# Patient Record
Sex: Female | Born: 1940 | Race: Black or African American | Hispanic: No | Marital: Married | Smoking: Never smoker
Health system: Southern US, Community
[De-identification: ages and names within clinical notes are randomized; demographics above are authoritative.]

## PROBLEM LIST (undated history)

## (undated) DIAGNOSIS — E119 Type 2 diabetes mellitus without complications: Secondary | ICD-10-CM

## (undated) DIAGNOSIS — M109 Gout, unspecified: Secondary | ICD-10-CM

## (undated) DIAGNOSIS — E079 Disorder of thyroid, unspecified: Secondary | ICD-10-CM

## (undated) DIAGNOSIS — I10 Essential (primary) hypertension: Secondary | ICD-10-CM

## (undated) HISTORY — PX: CHOLECYSTECTOMY: SHX55

## (undated) HISTORY — PX: ABDOMINAL HYSTERECTOMY: SHX81

---

## 2014-09-24 ENCOUNTER — Emergency Department (HOSPITAL_COMMUNITY): Payer: Medicare HMO

## 2014-09-24 ENCOUNTER — Encounter (HOSPITAL_COMMUNITY): Payer: Self-pay | Admitting: Emergency Medicine

## 2014-09-24 ENCOUNTER — Inpatient Hospital Stay (HOSPITAL_COMMUNITY)
Admission: EM | Admit: 2014-09-24 | Discharge: 2014-09-26 | DRG: 392 | Disposition: A | Discharge status: Home / Self Care | Payer: Medicare HMO | Attending: Internal Medicine | Admitting: Internal Medicine

## 2014-09-24 DIAGNOSIS — Z823 Family history of stroke: Secondary | ICD-10-CM

## 2014-09-24 DIAGNOSIS — R1084 Generalized abdominal pain: Secondary | ICD-10-CM | POA: Diagnosis not present

## 2014-09-24 DIAGNOSIS — Z79899 Other long term (current) drug therapy: Secondary | ICD-10-CM

## 2014-09-24 DIAGNOSIS — N179 Acute kidney failure, unspecified: Secondary | ICD-10-CM | POA: Diagnosis present

## 2014-09-24 DIAGNOSIS — Z833 Family history of diabetes mellitus: Secondary | ICD-10-CM

## 2014-09-24 DIAGNOSIS — E039 Hypothyroidism, unspecified: Secondary | ICD-10-CM | POA: Diagnosis present

## 2014-09-24 DIAGNOSIS — Z9049 Acquired absence of other specified parts of digestive tract: Secondary | ICD-10-CM | POA: Diagnosis present

## 2014-09-24 DIAGNOSIS — E119 Type 2 diabetes mellitus without complications: Secondary | ICD-10-CM | POA: Diagnosis present

## 2014-09-24 DIAGNOSIS — K529 Noninfective gastroenteritis and colitis, unspecified: Principal | ICD-10-CM | POA: Diagnosis present

## 2014-09-24 DIAGNOSIS — E860 Dehydration: Secondary | ICD-10-CM | POA: Diagnosis present

## 2014-09-24 DIAGNOSIS — R109 Unspecified abdominal pain: Secondary | ICD-10-CM | POA: Diagnosis not present

## 2014-09-24 DIAGNOSIS — R1013 Epigastric pain: Secondary | ICD-10-CM | POA: Diagnosis not present

## 2014-09-24 DIAGNOSIS — E876 Hypokalemia: Secondary | ICD-10-CM | POA: Diagnosis present

## 2014-09-24 DIAGNOSIS — Z8249 Family history of ischemic heart disease and other diseases of the circulatory system: Secondary | ICD-10-CM

## 2014-09-24 DIAGNOSIS — A419 Sepsis, unspecified organism: Secondary | ICD-10-CM | POA: Diagnosis present

## 2014-09-24 DIAGNOSIS — K219 Gastro-esophageal reflux disease without esophagitis: Secondary | ICD-10-CM | POA: Diagnosis present

## 2014-09-24 DIAGNOSIS — R6510 Systemic inflammatory response syndrome (SIRS) of non-infectious origin without acute organ dysfunction: Secondary | ICD-10-CM | POA: Diagnosis present

## 2014-09-24 DIAGNOSIS — R112 Nausea with vomiting, unspecified: Secondary | ICD-10-CM | POA: Diagnosis not present

## 2014-09-24 DIAGNOSIS — I10 Essential (primary) hypertension: Secondary | ICD-10-CM | POA: Diagnosis present

## 2014-09-24 DIAGNOSIS — E872 Acidosis: Secondary | ICD-10-CM | POA: Diagnosis present

## 2014-09-24 DIAGNOSIS — Z794 Long term (current) use of insulin: Secondary | ICD-10-CM | POA: Diagnosis not present

## 2014-09-24 DIAGNOSIS — M109 Gout, unspecified: Secondary | ICD-10-CM | POA: Diagnosis present

## 2014-09-24 DIAGNOSIS — R7989 Other specified abnormal findings of blood chemistry: Secondary | ICD-10-CM | POA: Insufficient documentation

## 2014-09-24 HISTORY — DX: Essential (primary) hypertension: I10

## 2014-09-24 HISTORY — DX: Disorder of thyroid, unspecified: E07.9

## 2014-09-24 HISTORY — DX: Gout, unspecified: M10.9

## 2014-09-24 HISTORY — DX: Type 2 diabetes mellitus without complications: E11.9

## 2014-09-24 LAB — CBC WITH DIFFERENTIAL/PLATELET
BASOS ABS: 0 10*3/uL (ref 0.0–0.1)
Basophils Relative: 0 % (ref 0–1)
EOS ABS: 0.1 10*3/uL (ref 0.0–0.7)
Eosinophils Relative: 0 % (ref 0–5)
HCT: 42.3 % (ref 36.0–46.0)
Hemoglobin: 14.3 g/dL (ref 12.0–15.0)
Lymphocytes Relative: 6 % — ABNORMAL LOW (ref 12–46)
Lymphs Abs: 0.7 10*3/uL (ref 0.7–4.0)
MCH: 32 pg (ref 26.0–34.0)
MCHC: 33.8 g/dL (ref 30.0–36.0)
MCV: 94.6 fL (ref 78.0–100.0)
Monocytes Absolute: 0.6 10*3/uL (ref 0.1–1.0)
Monocytes Relative: 5 % (ref 3–12)
NEUTROS PCT: 89 % — AB (ref 43–77)
Neutro Abs: 11.2 10*3/uL — ABNORMAL HIGH (ref 1.7–7.7)
PLATELETS: 312 10*3/uL (ref 150–400)
RBC: 4.47 MIL/uL (ref 3.87–5.11)
RDW: 13.6 % (ref 11.5–15.5)
WBC: 12.6 10*3/uL — ABNORMAL HIGH (ref 4.0–10.5)

## 2014-09-24 LAB — COMPREHENSIVE METABOLIC PANEL
ALK PHOS: 113 U/L (ref 38–126)
ALT: 62 U/L — AB (ref 14–54)
AST: 76 U/L — AB (ref 15–41)
Albumin: 4.8 g/dL (ref 3.5–5.0)
Anion gap: 16 — ABNORMAL HIGH (ref 5–15)
BILIRUBIN TOTAL: 0.5 mg/dL (ref 0.3–1.2)
BUN: 22 mg/dL — ABNORMAL HIGH (ref 6–20)
CALCIUM: 9.6 mg/dL (ref 8.9–10.3)
CO2: 24 mmol/L (ref 22–32)
Chloride: 100 mmol/L — ABNORMAL LOW (ref 101–111)
Creatinine, Ser: 1.02 mg/dL — ABNORMAL HIGH (ref 0.44–1.00)
GFR calc non Af Amer: 53 mL/min — ABNORMAL LOW (ref >60)
GLUCOSE: 176 mg/dL — AB (ref 70–99)
POTASSIUM: 3.1 mmol/L — AB (ref 3.5–5.1)
Sodium: 140 mmol/L (ref 135–145)
Total Protein: 9 g/dL — ABNORMAL HIGH (ref 6.5–8.1)

## 2014-09-24 LAB — PROTIME-INR
INR: 1.02 (ref 0.00–1.49)
Prothrombin Time: 13.6 seconds (ref 11.6–15.2)

## 2014-09-24 LAB — PROCALCITONIN: Procalcitonin: 2.8 ng/mL

## 2014-09-24 LAB — URINALYSIS, ROUTINE W REFLEX MICROSCOPIC
BILIRUBIN URINE: NEGATIVE
Glucose, UA: NEGATIVE mg/dL
Hgb urine dipstick: NEGATIVE
Ketones, ur: NEGATIVE mg/dL
Leukocytes, UA: NEGATIVE
NITRITE: NEGATIVE
PROTEIN: NEGATIVE mg/dL
Specific Gravity, Urine: 1.029 (ref 1.005–1.030)
UROBILINOGEN UA: 0.2 mg/dL (ref 0.0–1.0)
pH: 5 (ref 5.0–8.0)

## 2014-09-24 LAB — I-STAT CG4 LACTIC ACID, ED
LACTIC ACID, VENOUS: 3.36 mmol/L — AB (ref 0.5–2.0)
Lactic Acid, Venous: 2.98 mmol/L (ref 0.5–2.0)

## 2014-09-24 LAB — GLUCOSE, CAPILLARY: Glucose-Capillary: 148 mg/dL — ABNORMAL HIGH (ref 70–99)

## 2014-09-24 LAB — LIPASE, BLOOD: Lipase: 45 U/L (ref 22–51)

## 2014-09-24 LAB — APTT: aPTT: 28 seconds (ref 24–37)

## 2014-09-24 LAB — LACTIC ACID, PLASMA: Lactic Acid, Venous: 2.7 mmol/L (ref 0.5–2.0)

## 2014-09-24 MED ORDER — PIPERACILLIN-TAZOBACTAM 3.375 G IVPB
3.3750 g | Freq: Three times a day (TID) | INTRAVENOUS | Status: DC
  Filled 2014-09-24 (×2): qty 50

## 2014-09-24 MED ORDER — METRONIDAZOLE IN NACL 5-0.79 MG/ML-% IV SOLN
500.0000 mg | Freq: Three times a day (TID) | INTRAVENOUS | Status: DC
  Administered 2014-09-24 – 2014-09-26 (×5): 500 mg via INTRAVENOUS
  Filled 2014-09-24 (×6): qty 100

## 2014-09-24 MED ORDER — ONDANSETRON HCL 4 MG/2ML IJ SOLN: 4.0000 mg | Freq: Four times a day (QID) | INTRAMUSCULAR | Status: DC | PRN

## 2014-09-24 MED ORDER — ONDANSETRON HCL 4 MG PO TABS: 4.0000 mg | ORAL_TABLET | Freq: Four times a day (QID) | ORAL | Status: DC | PRN

## 2014-09-24 MED ORDER — ONDANSETRON HCL 4 MG/2ML IJ SOLN: 4.0000 mg | Freq: Three times a day (TID) | INTRAMUSCULAR | Status: DC | PRN

## 2014-09-24 MED ORDER — VANCOMYCIN HCL IN DEXTROSE 1-5 GM/200ML-% IV SOLN
1000.0000 mg | Freq: Once | INTRAVENOUS | Status: AC
  Administered 2014-09-24: 1000 mg via INTRAVENOUS
  Filled 2014-09-24: qty 200

## 2014-09-24 MED ORDER — IOHEXOL 300 MG/ML  SOLN
100.0000 mL | Freq: Once | INTRAMUSCULAR | Status: AC | PRN
  Administered 2014-09-24: 100 mL via INTRAVENOUS

## 2014-09-24 MED ORDER — ACETAMINOPHEN 650 MG RE SUPP: 650.0000 mg | Freq: Four times a day (QID) | RECTAL | Status: DC | PRN

## 2014-09-24 MED ORDER — HYDRALAZINE HCL 50 MG PO TABS
100.0000 mg | ORAL_TABLET | Freq: Three times a day (TID) | ORAL | Status: DC
  Administered 2014-09-24 – 2014-09-25 (×4): 100 mg via ORAL
  Filled 2014-09-24 (×7): qty 2

## 2014-09-24 MED ORDER — GI COCKTAIL ~~LOC~~
30.0000 mL | Freq: Once | ORAL | Status: AC
  Administered 2014-09-24: 30 mL via ORAL
  Filled 2014-09-24: qty 30

## 2014-09-24 MED ORDER — MORPHINE SULFATE 2 MG/ML IJ SOLN: 1.0000 mg | INTRAMUSCULAR | Status: DC | PRN

## 2014-09-24 MED ORDER — INSULIN ASPART 100 UNIT/ML ~~LOC~~ SOLN
0.0000 [IU] | Freq: Three times a day (TID) | SUBCUTANEOUS | Status: DC
  Administered 2014-09-25 (×3): 2 [IU] via SUBCUTANEOUS
  Administered 2014-09-26: 1 [IU] via SUBCUTANEOUS

## 2014-09-24 MED ORDER — MORPHINE SULFATE 4 MG/ML IJ SOLN
4.0000 mg | Freq: Once | INTRAMUSCULAR | Status: AC
  Administered 2014-09-24: 4 mg via INTRAVENOUS
  Filled 2014-09-24: qty 1

## 2014-09-24 MED ORDER — SODIUM CHLORIDE 0.9 % IV BOLUS (SEPSIS)
1000.0000 mL | INTRAVENOUS | Status: AC
  Administered 2014-09-24 (×3): 1000 mL via INTRAVENOUS

## 2014-09-24 MED ORDER — POTASSIUM CHLORIDE 10 MEQ/100ML IV SOLN
10.0000 meq | INTRAVENOUS | Status: AC
  Administered 2014-09-24 (×2): 10 meq via INTRAVENOUS
  Filled 2014-09-24: qty 100

## 2014-09-24 MED ORDER — HYDROMORPHONE HCL 1 MG/ML IJ SOLN: 0.5000 mg | INTRAMUSCULAR | Status: DC | PRN

## 2014-09-24 MED ORDER — LEVOTHYROXINE SODIUM 137 MCG PO TABS
137.0000 ug | ORAL_TABLET | Freq: Every day | ORAL | Status: DC
  Administered 2014-09-25 – 2014-09-26 (×2): 137 ug via ORAL
  Filled 2014-09-24 (×3): qty 1

## 2014-09-24 MED ORDER — SODIUM CHLORIDE 0.9 % IV SOLN
Freq: Once | INTRAVENOUS | Status: AC
  Administered 2014-09-24: 1000 mL/h via INTRAVENOUS

## 2014-09-24 MED ORDER — SODIUM CHLORIDE 0.9 % IV BOLUS (SEPSIS)
1000.0000 mL | Freq: Once | INTRAVENOUS | Status: AC
  Administered 2014-09-24: 1000 mL via INTRAVENOUS

## 2014-09-24 MED ORDER — PIPERACILLIN-TAZOBACTAM 3.375 G IVPB 30 MIN
3.3750 g | Freq: Once | INTRAVENOUS | Status: AC
  Administered 2014-09-24: 3.375 g via INTRAVENOUS
  Filled 2014-09-24: qty 50

## 2014-09-24 MED ORDER — CIPROFLOXACIN IN D5W 400 MG/200ML IV SOLN
400.0000 mg | Freq: Two times a day (BID) | INTRAVENOUS | Status: DC
  Administered 2014-09-24 – 2014-09-25 (×3): 400 mg via INTRAVENOUS
  Filled 2014-09-24 (×4): qty 200

## 2014-09-24 MED ORDER — ACETAMINOPHEN 325 MG PO TABS: 650.0000 mg | ORAL_TABLET | Freq: Four times a day (QID) | ORAL | Status: DC | PRN

## 2014-09-24 MED ORDER — OMEGA-3-ACID ETHYL ESTERS 1 G PO CAPS
1.0000 g | ORAL_CAPSULE | Freq: Every day | ORAL | Status: DC
  Administered 2014-09-25: 1 g via ORAL
  Filled 2014-09-24 (×2): qty 1

## 2014-09-24 MED ORDER — IOHEXOL 350 MG/ML SOLN: 100.0000 mL | Freq: Once | INTRAVENOUS | Status: DC | PRN

## 2014-09-24 MED ORDER — ALLOPURINOL 150 MG HALF TABLET
150.0000 mg | ORAL_TABLET | Freq: Every day | ORAL | Status: DC
  Administered 2014-09-24 – 2014-09-25 (×2): 150 mg via ORAL
  Filled 2014-09-24 (×3): qty 1

## 2014-09-24 MED ORDER — AMLODIPINE BESYLATE 10 MG PO TABS
10.0000 mg | ORAL_TABLET | Freq: Every day | ORAL | Status: DC
Start: 2014-09-24 — End: 2014-09-26
  Administered 2014-09-24 – 2014-09-25 (×2): 10 mg via ORAL
  Filled 2014-09-24 (×3): qty 1

## 2014-09-24 MED ORDER — VANCOMYCIN HCL IN DEXTROSE 750-5 MG/150ML-% IV SOLN
750.0000 mg | Freq: Two times a day (BID) | INTRAVENOUS | Status: DC
Start: 2014-09-24 — End: 2014-09-24
  Filled 2014-09-24: qty 150

## 2014-09-24 MED ORDER — LORATADINE 10 MG PO TABS
10.0000 mg | ORAL_TABLET | Freq: Every day | ORAL | Status: DC
  Administered 2014-09-25: 10 mg via ORAL
  Filled 2014-09-24 (×2): qty 1

## 2014-09-24 MED ORDER — SODIUM CHLORIDE 0.9 % IV SOLN
INTRAVENOUS | Status: DC
  Administered 2014-09-24 – 2014-09-25 (×2): 125 mL/h via INTRAVENOUS

## 2014-09-24 MED ORDER — DOXAZOSIN MESYLATE 2 MG PO TABS
2.0000 mg | ORAL_TABLET | Freq: Every day | ORAL | Status: DC
  Administered 2014-09-24 – 2014-09-25 (×2): 2 mg via ORAL
  Filled 2014-09-24 (×3): qty 1

## 2014-09-24 NOTE — ED Notes (Signed)
lactic Acid of 2.98 reported to PA Sherlynn CarbonNicolle and [employer]

## 2014-09-24 NOTE — ED Notes (Signed)
Per EMS pt acute onset of abdominal cramping and emesis approximately an hour ago. Pt given zofran en route; no emesis at present time.

## 2014-09-24 NOTE — ED Provider Notes (Signed)
Medical screening examination/treatment/procedure(s) were conducted as a shared visit with non-physician practitioner(s) and myself.  I personally evaluated the patient during the encounter.   EKG Interpretation None     Patient here with abdominal pain and emesis which began today. Abdominal exam without signs of peritonitis. Patient's lactic acid has been trending numbers. Sepsis order set used an antibiotic will be empirically started. Will be admitted to the hospital  Lorre NickAnthony Sumit Branham, MD 09/24/14 1721

## 2014-09-24 NOTE — H&P (Signed)
[facility] History and Physical  Clinton GallantCornelia Shillingford VHQ:[id number]:[id number] DOB: Mar 10, 1941 DOA: 09/24/2014  Referring physician: Ms.Hannah. PCP: No primary care provider on file. Dr.Velazquez. Specialists: None.  Chief Complaint: Nausea vomiting and abdominal pain.  HPI: Anna Byrd is a 74 y.o. female with history of diabetes mellitus type 2, hypothyroidism, gout, hypertension presents to the ER because of persistent nausea and vomiting with abdominal pain. Patient states her symptoms are at 9:30 AM and 1 hour prior to start of symptoms patient had crab meat. Patient's abdominal pain is mostly in the upper quadrants cramping in nature. Denies any diarrhea. Patient had fever and chills. In the ER patient was found to be febrile tachycardic with lactic acid level elevated. CT abdomen and pelvis was unremarkable. Patient has been admitted for sepsis secondary to intra-abdominal source. Patient denies any chest pain shortness of breath productive cough. Denies any recent travel or sick contacts. Patient states her husband ate the same crab meat but has not had any symptoms.  Review of Systems: As presented in the history of presenting illness, rest negative.  Past Medical History  Diagnosis Date  . Thyroid disease   . Hypertension   . Diabetes mellitus without complication   . Gout    Past Surgical History  Procedure Laterality Date  . Cholecystectomy    . Abdominal hysterectomy     Social History:  reports that she has never smoked. She does not have any smokeless tobacco history on file. She reports that she does not drink alcohol or use illicit drugs. Where does patient live home. Can patient participate in ADLs? Yes.  Allergies  Allergen Reactions  . Other     Crab Legs- nausea/vommitting    Family History:  Family History  Problem Relation Age of Onset  . Diabetes Mellitus II Mother   . Hypertension Mother   . Stroke Sister       Prior to Admission medications    Medication Sig Start Date End Date Taking? Authorizing Provider  allopurinol (ZYLOPRIM) 300 MG tablet Take 150 mg by mouth daily. 08/09/14  Yes Historical Provider, MD  amLODipine (NORVASC) 10 MG tablet Take 1 tablet by mouth daily. 07/09/14  Yes Historical Provider, MD  CINNAMON PO Take 1 tablet by mouth daily.   Yes Historical Provider, MD  doxazosin (CARDURA) 2 MG tablet Take 2 mg by mouth at bedtime.  08/22/14  Yes Historical Provider, MD  hydrALAZINE (APRESOLINE) 50 MG tablet Take 100 mg by mouth 3 (three) times daily. anxiety 08/07/14  Yes Historical Provider, MD  levothyroxine (SYNTHROID, LEVOTHROID) 137 MCG tablet Take 1 tablet by mouth daily. 07/06/14  Yes Historical Provider, MD  loratadine (CLARITIN) 10 MG tablet Take 10 mg by mouth daily.   Yes Historical Provider, MD  metFORMIN (GLUCOPHAGE) 1000 MG tablet Take 1 tablet by mouth 2 (two) times daily. 07/09/14  Yes Historical Provider, MD  Omega-3 Fatty Acids (FISH OIL PO) Take 1 capsule by mouth 2 (two) times daily.   Yes Historical Provider, MD  triamterene-hydrochlorothiazide (MAXZIDE-25) 37.5-25 MG per tablet Take 1 tablet by mouth daily. 07/09/14  Yes Historical Provider, MD  VITAMIN E PO Take 1 tablet by mouth daily.   Yes Historical Provider, MD    Physical Exam: Filed Vitals:   09/24/14 1900 09/24/14 1930 09/24/14 2030 09/24/14 2032  BP: 149/78 143/74  148/80  Pulse: 106 106  112  Temp:    100.2 F (37.9 C)  TempSrc:    Oral  Resp: 26 22  20  Height:   5.2" (0.132 m)   Weight:   98.3 kg (216 lb 11.4 oz)   SpO2: 94% 93%  96%     General:  Well-developed and nourished.  Eyes: Anicteric no pallor.  ENT: No discharge from the ears eyes nose and mouth.  Neck: No mass felt.  Cardiovascular: S1 and S2 heard.  Respiratory: No rhonchi or crepitations.  Abdomen: Soft nontender bowel sounds present. No guarding or rigidity.  Skin: No rash.  Musculoskeletal: No edema.  Psychiatric: Appears normal.  Neurologic: Alert  awake oriented to time place and person. Moves all extremities.  Labs on Admission:  Basic Metabolic Panel:  Recent Labs Lab 09/24/14 1402  NA 140  K 3.1*  CL 100*  CO2 24  GLUCOSE 176*  BUN 22*  CREATININE 1.02*  CALCIUM 9.6   Liver Function Tests:  Recent Labs Lab 09/24/14 1402  AST 76*  ALT 62*  ALKPHOS 113  BILITOT 0.5  PROT 9.0*  ALBUMIN 4.8    Recent Labs Lab 09/24/14 1402  LIPASE 45   No results for input(s): AMMONIA in the last 168 hours. CBC:  Recent Labs Lab 09/24/14 1402  WBC 12.6*  NEUTROABS 11.2*  HGB 14.3  HCT 42.3  MCV 94.6  PLT 312   Cardiac Enzymes: No results for input(s): CKTOTAL, CKMB, CKMBINDEX, TROPONINI in the last 168 hours.  BNP (last 3 results) No results for input(s): BNP in the last 8760 hours.  ProBNP (last 3 results) No results for input(s): PROBNP in the last 8760 hours.  CBG: No results for input(s): GLUCAP in the last 168 hours.  Radiological Exams on Admission: Ct Abdomen Pelvis W Contrast  09/24/2014   CLINICAL DATA:  Upper abdominal pain, nausea and vomiting  EXAM: CT ABDOMEN AND PELVIS WITH CONTRAST  TECHNIQUE: Multidetector CT imaging of the abdomen and pelvis was performed using the standard protocol following bolus administration of intravenous contrast.  CONTRAST:  100mL OMNIPAQUE IOHEXOL 300 MG/ML  SOLN  COMPARISON:  None.  FINDINGS: Lower chest: Lung bases are clear. Nodularity in the right central breast is partly imaged.  Hepatobiliary: Cholecystectomy clips are noted. Hepatic hypodensity suggests steatosis without focal abnormality or intrahepatic ductal dilatation.  Pancreas: Normal  Spleen: Normal  Adrenals/Urinary Tract: Adrenal glands are normal. 5 mm too small to characterize right mid renal cortical hypodense lesions are identified images 40 and 41, respectively, statistically most likely cysts. No hydroureteronephrosis. No radiopaque renal, ureteral, or bladder calculus. The bladder is decompressed but  unremarkable.  Stomach/Bowel: Appendix not identified but no secondary evidence for acute appendicitis is identified. No bowel wall thickening or focal segmental dilatation is identified.  Vascular/Lymphatic: Mild atheromatous aortic calcification without aneurysm. No lymphadenopathy.  Reproductive: Uterus and ovaries presumed surgically absent, not visualized. No adnexal mass.  Other: No free air or fluid.  Musculoskeletal: Lumbar spine disc degenerative change is identified, most prominent at L4-L5. No acute osseous abnormality.  IMPRESSION: No acute intra-abdominal or pelvic pathology.   Electronically Signed   By: Christiana PellantGretchen  Green M.D.   On: 09/24/2014 16:34   Dg Chest Port 1 View  09/24/2014   CLINICAL DATA:  Acute onset of abdominal cramping and emesis 1 hr ago  EXAM: PORTABLE CHEST - 1 VIEW  COMPARISON:  None.  FINDINGS: A single AP portable view of the chest demonstrates no focal airspace consolidation or alveolar edema. The lungs are grossly clear. There is no large effusion or pneumothorax. Cardiac and mediastinal contours appear unremarkable.  IMPRESSION: No active  disease.   Electronically Signed   By: Ellery Plunk M.D.   On: 09/24/2014 18:17     Assessment/Plan Principal Problem:   Sepsis Active Problems:   Abdominal pain   Diabetes mellitus type 2, controlled   Essential hypertension   1. Sepsis most likely source is intra-abdominal - follow blood cultures. At this time patient has been empirically placed on Cipro and Flagyl. Check pro-calcitonin levels and closely follow lactic acid level as patient gets hydrated. Since patient has had some blood-tinged vomitus follow CBC closely. 2. Elevated LFTs most likely from sepsis - CT abdomen and pelvis was unremarkable. Follow LFTs. 3. Renal failure probably acute - we do not have any baseline creatinine on the patient. Continue hydration and hold diuretics and follow metabolic panel. 4. Diabetes mellitus type 2 - hold metformin while  patient has lactic acidosis patient has been placed on sliding scale coverage. 5. Hypertension - hold diuretics while patient is getting aggressive hydration. 6. Gout on allopurinol.   DVT Prophylaxis since patient complained of some blood-tinged vomitus will keep patient on SCDs for now and oral CBC.  Code Status: Full code.  Family Communication: None.  Disposition Plan: Admit to inpatient. Will likely stay would be 2-3 days.    KAKRAKANDY,ARSHAD N. [facility] Pager [phone].  If 7PM-7AM, please contact night-coverage [email or web address] Password [email or web address] 09/24/2014, 9:07 PM

## 2014-09-24 NOTE — ED Notes (Signed)
Labs drawn by trevor 

## 2014-09-24 NOTE — ED Provider Notes (Signed)
CSN: [id number]     Arrival date & time 09/24/14  1332 History   First MD Initiated Contact with Patient 09/24/14 1504     Chief Complaint  Patient presents with  . Abdominal Pain     (Consider location/radiation/quality/duration/timing/severity/associated sxs/prior Treatment) The history is provided by the patient, the spouse and medical records. No language interpreter was used.     Nancey Montez MoritaCarter is a 74 y.o. female  with a hx of HTN, hypothyroid, NIDDM, gout  presents to the Emergency Department complaining of acute, persistent, generalized abd pain with associated vomiting x10 onset 11:30am. Pt reports 1 episode of blood streaks after intense vomiting.  Pt denies bilious emesis.  Pt reports she ate crab legs around 9:30am this morning.  Pt reports hx of cholecystectomy (1986),  or GI problems.  Pt rates her pain at a 9/10 without radiation and described as aching.  Nothing makes it better or worse.  Pt denies fever, chills, headache, neck pain, chest pain, SOB, diarrhea, weakness, dizziness, syncope.      Past Medical History  Diagnosis Date  . Thyroid disease   . Hypertension   . Diabetes mellitus without complication   . Gout    History reviewed. No pertinent past surgical history. No family history on file. History  Substance Use Topics  . Smoking status: Never Smoker   . Smokeless tobacco: Not on file  . Alcohol Use: No   OB History    No data available     Review of Systems  Constitutional: Negative for fever, diaphoresis, appetite change, fatigue and unexpected weight change.  HENT: Negative for mouth sores and trouble swallowing.   Respiratory: Negative for cough, chest tightness, shortness of breath, wheezing and stridor.   Cardiovascular: Negative for chest pain and palpitations.  Gastrointestinal: Positive for nausea, vomiting and abdominal pain. Negative for diarrhea, constipation, blood in stool, abdominal distention and rectal pain.  Genitourinary: Negative  for dysuria, urgency, frequency, hematuria, flank pain and difficulty urinating.  Musculoskeletal: Negative for back pain, neck pain and neck stiffness.  Skin: Negative for rash.  Neurological: Negative for weakness.  Hematological: Negative for adenopathy.  Psychiatric/Behavioral: Negative for confusion.  All other systems reviewed and are negative.     Allergies  Other  Home Medications   Prior to Admission medications   Medication Sig Start Date End Date Taking? Authorizing Provider  allopurinol (ZYLOPRIM) 300 MG tablet Take 150 mg by mouth daily. 08/09/14  Yes Historical Provider, MD  amLODipine (NORVASC) 10 MG tablet Take 1 tablet by mouth daily. 07/09/14  Yes Historical Provider, MD  CINNAMON PO Take 1 tablet by mouth daily.   Yes Historical Provider, MD  doxazosin (CARDURA) 2 MG tablet Take 2 mg by mouth at bedtime.  08/22/14  Yes Historical Provider, MD  hydrALAZINE (APRESOLINE) 50 MG tablet Take 100 mg by mouth 3 (three) times daily. anxiety 08/07/14  Yes Historical Provider, MD  levothyroxine (SYNTHROID, LEVOTHROID) 137 MCG tablet Take 1 tablet by mouth daily. 07/06/14  Yes Historical Provider, MD  loratadine (CLARITIN) 10 MG tablet Take 10 mg by mouth daily.   Yes Historical Provider, MD  metFORMIN (GLUCOPHAGE) 1000 MG tablet Take 1 tablet by mouth 2 (two) times daily. 07/09/14  Yes Historical Provider, MD  Omega-3 Fatty Acids (FISH OIL PO) Take 1 capsule by mouth 2 (two) times daily.   Yes Historical Provider, MD  triamterene-hydrochlorothiazide (MAXZIDE-25) 37.5-25 MG per tablet Take 1 tablet by mouth daily. 07/09/14  Yes Historical Provider, MD  VITAMIN E PO Take 1 tablet by mouth daily.   Yes Historical Provider, MD   BP 149/78 mmHg  Pulse 106  Temp(Src) 100.3 F (37.9 C) (Rectal)  Resp 26  Wt 212 lb (96.163 kg)  SpO2 94% Physical Exam  Constitutional: She appears well-developed and well-nourished. No distress.  Awake, alert, nontoxic appearance  HENT:  Head:  Normocephalic and atraumatic.  Mouth/Throat: Oropharynx is clear and moist. No oropharyngeal exudate.  Eyes: Conjunctivae are normal. No scleral icterus.  Neck: Normal range of motion. Neck supple.  Cardiovascular: Regular rhythm and intact distal pulses.  Tachycardia present.   Pulses:      Radial pulses are 2+ on the right side, and 2+ on the left side.       Dorsalis pedis pulses are 2+ on the right side, and 2+ on the left side.  Pulmonary/Chest: Effort normal and breath sounds normal. No respiratory distress. She has no wheezes.  Equal chest expansion  Abdominal: Soft. Bowel sounds are normal. She exhibits no distension and no mass. There is no tenderness. There is no rebound, no guarding and no CVA tenderness.  Obese Abd is soft and nontender, no rigidity or rebound Multiple well healed midline surgical incisions  Musculoskeletal: Normal range of motion. She exhibits no edema.  Neurological: She is alert.  Speech is clear and goal oriented Moves extremities without ataxia  Skin: Skin is warm and dry. She is not diaphoretic.  Psychiatric: She has a normal mood and affect.  Nursing note and vitals reviewed.   ED Course  Procedures (including critical care time) Labs Review Labs Reviewed  CBC WITH DIFFERENTIAL/PLATELET - Abnormal; Notable for the following:    WBC 12.6 (*)    Neutrophils Relative % 89 (*)    Neutro Abs 11.2 (*)    Lymphocytes Relative 6 (*)    All other components within normal limits  COMPREHENSIVE METABOLIC PANEL - Abnormal; Notable for the following:    Potassium 3.1 (*)    Chloride 100 (*)    Glucose, Bld 176 (*)    BUN 22 (*)    Creatinine, Ser 1.02 (*)    Total Protein 9.0 (*)    AST 76 (*)    ALT 62 (*)    GFR calc non Af Amer 53 (*)    Anion gap 16 (*)    All other components within normal limits  I-STAT CG4 LACTIC ACID, ED - Abnormal; Notable for the following:    Lactic Acid, Venous 2.98 (*)    All other components within normal limits   I-STAT CG4 LACTIC ACID, ED - Abnormal; Notable for the following:    Lactic Acid, Venous 3.36 (*)    All other components within normal limits  CULTURE, BLOOD (ROUTINE X 2)  CULTURE, BLOOD (ROUTINE X 2)  URINE CULTURE  LIPASE, BLOOD  URINALYSIS, ROUTINE W REFLEX MICROSCOPIC    Imaging Review Ct Abdomen Pelvis W Contrast  09/24/2014   CLINICAL DATA:  Upper abdominal pain, nausea and vomiting  EXAM: CT ABDOMEN AND PELVIS WITH CONTRAST  TECHNIQUE: Multidetector CT imaging of the abdomen and pelvis was performed using the standard protocol following bolus administration of intravenous contrast.  CONTRAST:  OMNIPAQUE IOHEXOL 300 MG/ML  SOLN  COMPARISON:  None.  FINDINGS: Lower chest: Lung bases are clear. Nodularity in the right central breast is partly imaged.  Hepatobiliary: Cholecystectomy clips are noted. Hepatic hypodensity suggests steatosis without focal abnormality or intrahepatic ductal dilatation.  Pancreas: Normal  Spleen: Normal  Adrenals/Urinary Tract: Adrenal glands are normal. 5 mm too small to characterize right mid renal cortical hypodense lesions are identified images 40 and 41, respectively, statistically most likely cysts. No hydroureteronephrosis. No radiopaque renal, ureteral, or bladder calculus. The bladder is decompressed but unremarkable.  Stomach/Bowel: Appendix not identified but no secondary evidence for acute appendicitis is identified. No bowel wall thickening or focal segmental dilatation is identified.  Vascular/Lymphatic: Mild atheromatous aortic calcification without aneurysm. No lymphadenopathy.  Reproductive: Uterus and ovaries presumed surgically absent, not visualized. No adnexal mass.  Other: No free air or fluid.  Musculoskeletal: Lumbar spine disc degenerative change is identified, most prominent at L4-L5. No acute osseous abnormality.  IMPRESSION: No acute intra-abdominal or pelvic pathology.   Electronically Signed   By: Christiana PellantGretchen  Green M.D.   On: 09/24/2014  16:34   Dg Chest Port 1 View  09/24/2014   CLINICAL DATA:  Acute onset of abdominal cramping and emesis 1 hr ago  EXAM: PORTABLE CHEST - 1 VIEW  COMPARISON:  None.  FINDINGS: A single AP portable view of the chest demonstrates no focal airspace consolidation or alveolar edema. The lungs are grossly clear. There is no large effusion or pneumothorax. Cardiac and mediastinal contours appear unremarkable.  IMPRESSION: No active disease.   Electronically Signed   By: Ellery Plunkaniel R Mitchell M.D.   On: 09/24/2014 18:17     EKG Interpretation None      MDM   Final diagnoses:  Abdominal pain  SIRS (systemic inflammatory response syndrome)  Elevated lactic acid level     Anneke Montez MoritaCarter presents with sudden onset abd pain; nausea and vomiting.  Labs with mild hypokalemia, leukocytosis.  Pt appears uncomfortable, but abd is nontender on exam.  Elevated lactic acid.  Pt is being given fluids, pain control. CT pending.    5:02 PM Pt with increasing lactic acid, persistent abd pain.  CT abd without acute abnormality.  Concerned as pt's lactic acid is rising.  Will begin sepsis protocol including fluids and antibiotics, but concern for possible mesenteric ischemia with increasing lactic, pain out of proportion on exam and vomiting.    5:14 PM Discussed with Dr. Chilton SiGreen of radiology who doesn't currently see any blatant signs of mesenteric ischemia.  Pt with fluid is her colon which might indicated a gastroenteritis.  Will continue to look for signs of infection and monitor lactic acid and pain levels.  Sepsis protocol initiated, fluid bolus, blood cultures and antibiotics.    7:23 PM Discussed with Dr. Toniann FailKakrakandy who will admit to med-surg.    The patient was discussed with and seen by Dr. Freida BusmanAllen who agrees with the treatment plan.   Dierdre ForthHannah Taelyn Broecker, PA-C 09/24/14 1923

## 2014-09-24 NOTE — ED Notes (Signed)
Family at bedside. 

## 2014-09-24 NOTE — ED Notes (Signed)
Bed: [id number] Expected date:  Expected time:  Means of arrival:  Comments: EMS- sudden emesis, IV established

## 2014-09-24 NOTE — ED Notes (Addendum)
Notified RN if lactic result 3.36

## 2014-09-24 NOTE — Progress Notes (Addendum)
ANTIBIOTIC CONSULT NOTE - INITIAL  Pharmacy Consult for vancomycin/zosyn --> ciprofloxacin/flagyl Indication: rule out sepsis (intra-abdominal)  Allergies  Allergen Reactions  . Other     Crab Legs- nausea/vommitting    Patient Measurements: Weight: 212 lb (96.163 kg) Adjusted Body Weight:   Vital Signs: Temp: 97.8 F (36.6 C) (05/09 1337) Temp Source: Oral (05/09 1337) BP: 129/66 mmHg (05/09 1544) Pulse Rate: 100 (05/09 1544) Intake/Output from previous day:   Intake/Output from this shift:    Labs:  Recent Labs  09/24/14 1402  WBC 12.6*  HGB 14.3  PLT 312  CREATININE 1.02*   CrCl cannot be calculated (Unknown ideal weight.). No results for input(s): VANCOTROUGH, VANCOPEAK, VANCORANDOM, GENTTROUGH, GENTPEAK, GENTRANDOM, TOBRATROUGH, TOBRAPEAK, TOBRARND, AMIKACINPEAK, AMIKACINTROU, AMIKACIN in the last 72 hours.   Microbiology: No results found for this or any previous visit (from the past 720 hour(s)).  Medical History: Past Medical History  Diagnosis Date  . Thyroid disease   . Hypertension   . Diabetes mellitus without complication   . Gout    Assessment: [age] YOF presents with abdominal pain and vomiting.  CT of abdomen was unrevealing. Lactic acid is trending up and code sepsis in ED called.  Vancomycin and zosyn per pharmacy ordered  5/9 >> vancomycin >> 5/9 5/9 >> zosyn >> 5/9 5/9 >> ciprofloxacin >> 5/9 >> metronidazole >>  5/9 Blood:  5/9 Urine:  WBC mildly elevated Afebrile Normalized CrCl = 3956ml/min  Goal of Therapy:  Vancomycin trough level 15-20 mcg/ml  Plan:   Vancomycin 1000mg  IV x 1 in ED then 750mg  IV q12h starting tonight to complete staggered loading dose  Check vancomycin trough if remains on vancomycin > 3 days  Follow renal function  Zosyn 3..375 gm IV over 30min x 1 then q8h over 4h infusion  Dannielle HuhZeigler, Dustin George 09/24/2014,5:10 PM   Addendum:   Upon admission [facility] has changed vanco/zosyn to ciprofloxacin with  pharmacy to dose (metronidazole per physician)   Ciprofloxacin 400mg  IV q12h  No dose adjustment needed at this time  Juliette Alcideustin Zeigler, PharmD, BCPS.   Pager: [phone] 09/24/2014 9:15 PM

## 2014-09-25 DIAGNOSIS — R1084 Generalized abdominal pain: Secondary | ICD-10-CM

## 2014-09-25 LAB — CBC WITH DIFFERENTIAL/PLATELET
BASOS PCT: 0 % (ref 0–1)
Basophils Absolute: 0 10*3/uL (ref 0.0–0.1)
EOS PCT: 0 % (ref 0–5)
Eosinophils Absolute: 0 10*3/uL (ref 0.0–0.7)
HCT: 31.2 % — ABNORMAL LOW (ref 36.0–46.0)
Hemoglobin: 10.6 g/dL — ABNORMAL LOW (ref 12.0–15.0)
Lymphocytes Relative: 20 % (ref 12–46)
Lymphs Abs: 2.3 10*3/uL (ref 0.7–4.0)
MCH: 31.8 pg (ref 26.0–34.0)
MCHC: 34 g/dL (ref 30.0–36.0)
MCV: 93.7 fL (ref 78.0–100.0)
MONO ABS: 0.9 10*3/uL (ref 0.1–1.0)
MONOS PCT: 8 % (ref 3–12)
NEUTROS PCT: 72 % (ref 43–77)
Neutro Abs: 8.5 10*3/uL — ABNORMAL HIGH (ref 1.7–7.7)
PLATELETS: 241 10*3/uL (ref 150–400)
RBC: 3.33 MIL/uL — ABNORMAL LOW (ref 3.87–5.11)
RDW: 13.6 % (ref 11.5–15.5)
WBC: 11.7 10*3/uL — ABNORMAL HIGH (ref 4.0–10.5)

## 2014-09-25 LAB — URINE CULTURE
Colony Count: NO GROWTH
Culture: NO GROWTH

## 2014-09-25 LAB — COMPREHENSIVE METABOLIC PANEL
ALT: 48 U/L (ref 14–54)
AST: 48 U/L — AB (ref 15–41)
Albumin: 2.9 g/dL — ABNORMAL LOW (ref 3.5–5.0)
Alkaline Phosphatase: 72 U/L (ref 38–126)
Anion gap: 4 — ABNORMAL LOW (ref 5–15)
BUN: 15 mg/dL (ref 6–20)
CALCIUM: 7.1 mg/dL — AB (ref 8.9–10.3)
CO2: 22 mmol/L (ref 22–32)
Chloride: 111 mmol/L (ref 101–111)
Creatinine, Ser: 0.78 mg/dL (ref 0.44–1.00)
GFR calc non Af Amer: >60 mL/min (ref >60)
Glucose, Bld: 159 mg/dL — ABNORMAL HIGH (ref 70–99)
Potassium: 2.9 mmol/L — ABNORMAL LOW (ref 3.5–5.1)
SODIUM: 137 mmol/L (ref 135–145)
Total Bilirubin: 0.6 mg/dL (ref 0.3–1.2)
Total Protein: 6.3 g/dL — ABNORMAL LOW (ref 6.5–8.1)

## 2014-09-25 LAB — ABO/RH: ABO/RH(D): O POS

## 2014-09-25 LAB — CBC
HCT: 32.3 % — ABNORMAL LOW (ref 36.0–46.0)
Hemoglobin: 10.8 g/dL — ABNORMAL LOW (ref 12.0–15.0)
MCH: 31.5 pg (ref 26.0–34.0)
MCHC: 33.4 g/dL (ref 30.0–36.0)
MCV: 94.2 fL (ref 78.0–100.0)
PLATELETS: 251 10*3/uL (ref 150–400)
RBC: 3.43 MIL/uL — AB (ref 3.87–5.11)
RDW: 13.7 % (ref 11.5–15.5)
WBC: 11 10*3/uL — ABNORMAL HIGH (ref 4.0–10.5)

## 2014-09-25 LAB — LACTIC ACID, PLASMA
LACTIC ACID, VENOUS: 2.6 mmol/L — AB (ref 0.5–2.0)
LACTIC ACID, VENOUS: 2.1 mmol/L — AB (ref 0.5–2.0)

## 2014-09-25 LAB — TYPE AND SCREEN
ABO/RH(D): O POS
Antibody Screen: NEGATIVE

## 2014-09-25 LAB — GLUCOSE, CAPILLARY
GLUCOSE-CAPILLARY: 166 mg/dL — AB (ref 70–99)
GLUCOSE-CAPILLARY: 145 mg/dL — AB (ref 70–99)
Glucose-Capillary: 152 mg/dL — ABNORMAL HIGH (ref 70–99)
Glucose-Capillary: 162 mg/dL — ABNORMAL HIGH (ref 70–99)

## 2014-09-25 LAB — MAGNESIUM: Magnesium: 1.4 mg/dL — ABNORMAL LOW (ref 1.7–2.4)

## 2014-09-25 MED ORDER — POTASSIUM CHLORIDE CRYS ER 20 MEQ PO TBCR: 40.0000 meq | EXTENDED_RELEASE_TABLET | Freq: Once | ORAL | Status: DC

## 2014-09-25 MED ORDER — PANTOPRAZOLE SODIUM 40 MG IV SOLR
40.0000 mg | Freq: Two times a day (BID) | INTRAVENOUS | Status: DC
  Administered 2014-09-25 (×2): 40 mg via INTRAVENOUS
  Filled 2014-09-25 (×4): qty 40

## 2014-09-25 MED ORDER — SODIUM CHLORIDE 0.9 % IV BOLUS (SEPSIS)
1000.0000 mL | Freq: Once | INTRAVENOUS | Status: AC
  Administered 2014-09-25: 1000 mL via INTRAVENOUS

## 2014-09-25 MED ORDER — POTASSIUM CHLORIDE 10 MEQ/100ML IV SOLN
10.0000 meq | INTRAVENOUS | Status: AC
  Administered 2014-09-25 (×4): 10 meq via INTRAVENOUS
  Filled 2014-09-25 (×4): qty 100

## 2014-09-25 MED ORDER — POTASSIUM CHLORIDE CRYS ER 20 MEQ PO TBCR
40.0000 meq | EXTENDED_RELEASE_TABLET | ORAL | Status: AC
Start: 2014-09-25 — End: 2014-09-25
  Administered 2014-09-25 (×2): 40 meq via ORAL
  Filled 2014-09-25 (×2): qty 2

## 2014-09-25 MED ORDER — POTASSIUM CHLORIDE IN NACL 20-0.9 MEQ/L-% IV SOLN
INTRAVENOUS | Status: DC
  Filled 2014-09-25 (×2): qty 1000

## 2014-09-25 MED ORDER — MAGNESIUM SULFATE 2 GM/50ML IV SOLN
2.0000 g | Freq: Once | INTRAVENOUS | Status: AC
  Administered 2014-09-25: 2 g via INTRAVENOUS
  Filled 2014-09-25: qty 50

## 2014-09-25 NOTE — Progress Notes (Signed)
Patient Demographics  Anna Byrd, is a 74 y.o. female, DOB - September 08, 1940, ONG:[id number]  Admit date - 09/24/2014   Admitting Physician Eduard Clos, MD  Outpatient Primary MD for the patient is No primary care provider on file.  LOS - 1   Chief Complaint  Patient presents with  . Abdominal Pain        Subjective:   Anna Byrd today has, No headache, No chest pain, No abdominal pain - No Nausea, No new weakness tingling or numbness, No Cough - SOB. In terms free now  Assessment & Plan    1. Gastroenteritis induced dehydration and elevated lactic acid. Do not think she was septic, nontoxic appearing, afebrile, has rapidly improved and now completely symptom free. She did have some seafood after which she got sick. Continue Cipro Flagyl. Monitor cultures for 24 hours, and tinea hydration and advance diet. Monitor with supportive care.   2. DM type II. Hold metformin due to elevated lactic acid. On sliding scale.  CBG (last 3)   Recent Labs  09/24/14 2220 09/25/14 0749  GLUCAP 148* 152*    3. Hypertension. On Norvasc and hydralazine which will be continued.   4. GERD. On PPI.   5. Gout. Continue allopurinol.   6. ARF. From dehydration due to #1 above. Resolved with IV fluids.   7. Hypokalemia and hypomagnesemia. Both replaced will recheck in the morning.   8. Mildly elevated LFTs. Due to #1 above. CT scan abdomen pelvis stable, symptom-free, no right upper quadrant tenderness. Repeat CMP in the morning.    Code Status: Full  Family Communication: None present  Disposition Plan: Home likely 09/26/2014   Procedures CT scan abdomen and pelvis. Unremarkable   Consults      Medications  Scheduled Meds: . allopurinol  150 mg Oral Daily  . amLODipine   10 mg Oral Daily  . ciprofloxacin  400 mg Intravenous BID  . doxazosin  2 mg Oral QHS  . hydrALAZINE  100 mg Oral TID  . insulin aspart  0-9 Units Subcutaneous TID WC  . levothyroxine  137 mcg Oral QAC breakfast  . loratadine  10 mg Oral Daily  . metronidazole  500 mg Intravenous Q8H  . omega-3 acid ethyl esters  1 g Oral Daily  . pantoprazole (PROTONIX) IV  40 mg Intravenous Q12H  . potassium chloride  40 mEq Oral Q4H   Continuous Infusions: . 0.9 % NaCl with KCl 20 mEq / L     PRN Meds:.acetaminophen **OR** acetaminophen, morphine injection, ondansetron **OR** ondansetron (ZOFRAN) IV  DVT Prophylaxis   SCDs    Lab Results  Component Value Date   PLT 251 09/25/2014    Antibiotics     Anti-infectives    Start     Dose/Rate Route Frequency Ordered Stop   09/25/14 0000  piperacillin-tazobactam (ZOSYN) IVPB 3.375 g  Status:  Discontinued     3.375 g 12.5 mL/hr over 240 Minutes Intravenous 3 times per day 09/24/14 1721 09/24/14 2118   09/25/14 0000  ciprofloxacin (CIPRO) IVPB 400 mg     400 mg 200 mL/hr over 60 Minutes Intravenous 2 times daily 09/24/14 2117     09/24/14 2200  vancomycin (VANCOCIN) IVPB 750 mg/150 ml premix  Status:  Discontinued     750 mg 150 mL/hr over 60 Minutes Intravenous Every 12 hours 09/24/14 1721 09/24/14 2118   09/24/14 2200  metroNIDAZOLE (FLAGYL) IVPB 500 mg     500 mg 100 mL/hr over 60 Minutes Intravenous Every 8 hours 09/24/14 2106     09/24/14 1715  piperacillin-tazobactam (ZOSYN) IVPB 3.375 g     3.375 g 100 mL/hr over 30 Minutes Intravenous  Once 09/24/14 1702 09/24/14 1806   09/24/14 1715  vancomycin (VANCOCIN) IVPB 1000 mg/200 mL premix     1,000 mg 200 mL/hr over 60 Minutes Intravenous  Once 09/24/14 1702 09/24/14 1950          Objective:   Filed Vitals:   09/24/14 2157 09/25/14 0134 09/25/14 0529 09/25/14 0951  BP: 138/66 123/59 134/62 131/72  Pulse: 109 99 100 93  Temp: 99.9 F (37.7 C) 100 F (37.8 C) 98.9 F (37.2 C)  98.9 F (37.2 C)  TempSrc: Oral Oral Oral Oral  Resp: 20 18 18 18   Height:      Weight:      SpO2: 95% 96% 94% 96%    Wt Readings from Last 3 Encounters:  09/24/14 98.3 kg (216 lb 11.4 oz)     Intake/Output Summary (Last 24 hours) at 09/25/14 1118 Last data filed at 09/25/14 0951  Gross per 24 hour  Intake   3540 ml  Output   3200 ml  Net    340 ml     Physical Exam  Awake Alert, Oriented X 3, No new F.N deficits, Normal affect Anna Byrd,PERRAL Supple Neck,No JVD, No cervical lymphadenopathy appriciated.  Symmetrical Chest wall movement, Good air movement bilaterally, CTAB RRR,No Gallops,Rubs or new Murmurs, No Parasternal Heave +ve B.Sounds, Abd Soft, No tenderness, No organomegaly appriciated, No rebound - guarding or rigidity. No Cyanosis, Clubbing or edema, No new Rash or bruise      Data Review   Micro Results No results found for this or any previous visit (from the past 240 hour(s)).  Radiology Reports Ct Abdomen Pelvis W Contrast  09/24/2014   CLINICAL DATA:  Upper abdominal pain, nausea and vomiting  EXAM: CT ABDOMEN AND PELVIS WITH CONTRAST  TECHNIQUE: Multidetector CT imaging of the abdomen and pelvis was performed using the standard protocol following bolus administration of intravenous contrast.  CONTRAST:  100mL OMNIPAQUE IOHEXOL 300 MG/ML  SOLN  COMPARISON:  None.  FINDINGS: Lower chest: Lung bases are clear. Nodularity in the right central breast is partly imaged.  Hepatobiliary: Cholecystectomy clips are noted. Hepatic hypodensity suggests steatosis without focal abnormality or intrahepatic ductal dilatation.  Pancreas: Normal  Spleen: Normal  Adrenals/Urinary Tract: Adrenal glands are normal. 5 mm too small to characterize right mid renal cortical hypodense lesions are identified images 40 and 41, respectively, statistically most likely cysts. No hydroureteronephrosis. No radiopaque renal, ureteral, or bladder calculus. The bladder is decompressed but  unremarkable.  Stomach/Bowel: Appendix not identified but no secondary evidence for acute appendicitis is identified. No bowel wall thickening or focal segmental dilatation is identified.  Vascular/Lymphatic: Mild atheromatous aortic calcification without aneurysm. No lymphadenopathy.  Reproductive: Uterus and ovaries presumed surgically absent, not visualized. No adnexal mass.  Other: No free air or fluid.  Musculoskeletal: Lumbar spine disc degenerative change is identified, most prominent at L4-L5. No acute osseous abnormality.  IMPRESSION: No acute intra-abdominal or pelvic pathology.   Electronically Signed   By: Christiana PellantGretchen  Green M.D.   On: 09/24/2014 16:34   [facility]  09[facility]2014   CLINICAL DATA:  Acute onset of abdominal cramping and emesis 1 hr ago  EXAM: PORTABLE CHEST - 1 VIEW  COMPARISON:  None.  FINDINGS: A single AP portable view of the chest demonstrates no focal airspace consolidation or alveolar edema. The lungs are grossly clear. There is no large effusion or pneumothorax. Cardiac and mediastinal contours appear unremarkable.  IMPRESSION: No active disease.   Electronically Signed   By: Ellery Plunkaniel R Mitchell M.D.   On: 09/24/2014 18:17     CBC  Recent Labs Lab 09/24/14 1402 09/25/14 0400 09/25/14 0830  WBC 12.6* 11.7* 11.0*  HGB 14.3 10.6* 10.8*  HCT 42.3 31.2* 32.3*  PLT 312 241 251  MCV 94.6 93.7 94.2  MCH 32.0 31.8 31.5  MCHC 33.8 34.0 33.4  RDW 13.6 13.6 13.7  LYMPHSABS 0.7 2.3  --   MONOABS 0.6 0.9  --   EOSABS 0.1 0.0  --   BASOSABS 0.0 0.0  --     Chemistries   Recent Labs Lab 09/24/14 1402 09/25/14 0400 09/25/14 0428  NA 140 137  --   K 3.1* 2.9*  --   CL 100* 111  --   CO2 24 22  --   GLUCOSE 176* 159*  --   BUN 22* 15  --   CREATININE 1.02* 0.78  --   CALCIUM 9.6 7.1*  --   MG  --   --  1.4*  AST 76* 48*  --   ALT 62* 48  --   ALKPHOS 113 72  --   BILITOT 0.5 0.6  --     ------------------------------------------------------------------------------------------------------------------ CrCl cannot be calculated (Unknown ideal weight.). ------------------------------------------------------------------------------------------------------------------ No results for input(s): HGBA1C in the last 72 hours. ------------------------------------------------------------------------------------------------------------------ No results for input(s): CHOL, HDL, LDLCALC, TRIG, CHOLHDL, LDLDIRECT in the last 72 hours. ------------------------------------------------------------------------------------------------------------------ No results for input(s): TSH, T4TOTAL, T3FREE, THYROIDAB in the last 72 hours.  Invalid input(s): FREET3 ------------------------------------------------------------------------------------------------------------------ No results for input(s): VITAMINB12, FOLATE, FERRITIN, TIBC, IRON, RETICCTPCT in the last 72 hours.  Coagulation profile  Recent Labs Lab 09/24/14 2215  INR 1.02    No results for input(s): DDIMER in the last 72 hours.  Cardiac Enzymes No results for input(s): CKMB, TROPONINI, MYOGLOBIN in the last 168 hours.  Invalid input(s): CK ------------------------------------------------------------------------------------------------------------------ Invalid input(s): POCBNP     Time Spent in minutes  35   Arianis Bowditch K M.D on 09/25/2014 at 11:18 AM  Between 7am to 7pm - Pager - [phone]  After 7pm go to [email or web address] - password [email or web address]  [facility]   Office  [phone]

## 2014-09-25 NOTE — Progress Notes (Signed)
Lactic acid result  2.7 Dr. Ozzie HoyleKakrakandy  Gave orders for 1L NS.  1L NS given and completed at 0010.  Result after intervention still 2.6. Notified MD about new critical lab. Marcia BrashSophia Salim Forero, 09/25/14 [id number].

## 2014-09-26 DIAGNOSIS — R7989 Other specified abnormal findings of blood chemistry: Secondary | ICD-10-CM | POA: Insufficient documentation

## 2014-09-26 DIAGNOSIS — R1013 Epigastric pain: Secondary | ICD-10-CM

## 2014-09-26 DIAGNOSIS — E872 Acidosis: Secondary | ICD-10-CM

## 2014-09-26 DIAGNOSIS — R6510 Systemic inflammatory response syndrome (SIRS) of non-infectious origin without acute organ dysfunction: Secondary | ICD-10-CM | POA: Insufficient documentation

## 2014-09-26 LAB — COMPREHENSIVE METABOLIC PANEL
ALT: 57 U/L — ABNORMAL HIGH (ref 14–54)
ANION GAP: 9 (ref 5–15)
AST: 52 U/L — ABNORMAL HIGH (ref 15–41)
Albumin: 3.4 g/dL — ABNORMAL LOW (ref 3.5–5.0)
Alkaline Phosphatase: 81 U/L (ref 38–126)
BUN: 8 mg/dL (ref 6–20)
CO2: 23 mmol/L (ref 22–32)
CREATININE: 0.68 mg/dL (ref 0.44–1.00)
Calcium: 7.9 mg/dL — ABNORMAL LOW (ref 8.9–10.3)
Chloride: 108 mmol/L (ref 101–111)
GFR calc non Af Amer: >60 mL/min (ref >60)
Glucose, Bld: 138 mg/dL — ABNORMAL HIGH (ref 70–99)
POTASSIUM: 3.7 mmol/L (ref 3.5–5.1)
SODIUM: 140 mmol/L (ref 135–145)
TOTAL PROTEIN: 6.7 g/dL (ref 6.5–8.1)
Total Bilirubin: 0.6 mg/dL (ref 0.3–1.2)

## 2014-09-26 LAB — CBC
HEMATOCRIT: 32.5 % — AB (ref 36.0–46.0)
Hemoglobin: 11 g/dL — ABNORMAL LOW (ref 12.0–15.0)
MCH: 32 pg (ref 26.0–34.0)
MCHC: 33.8 g/dL (ref 30.0–36.0)
MCV: 94.5 fL (ref 78.0–100.0)
Platelets: 265 10*3/uL (ref 150–400)
RBC: 3.44 MIL/uL — ABNORMAL LOW (ref 3.87–5.11)
RDW: 14.1 % (ref 11.5–15.5)
WBC: 9.6 10*3/uL (ref 4.0–10.5)

## 2014-09-26 LAB — GLUCOSE, CAPILLARY: Glucose-Capillary: 132 mg/dL — ABNORMAL HIGH (ref 70–99)

## 2014-09-26 LAB — MAGNESIUM: Magnesium: 1.9 mg/dL (ref 1.7–2.4)

## 2014-09-26 MED ORDER — CIPROFLOXACIN HCL 500 MG PO TABS: 500.0000 mg | ORAL_TABLET | Freq: Two times a day (BID) | ORAL | Status: DC

## 2014-09-26 MED ORDER — METRONIDAZOLE 500 MG PO TABS: 500.0000 mg | ORAL_TABLET | Freq: Three times a day (TID) | ORAL | Status: AC

## 2014-09-26 NOTE — Progress Notes (Signed)
Discharge instructions and prescriptions given to patient .  Questions answered 

## 2014-09-26 NOTE — Discharge Summary (Signed)
Anna Byrd, is a 74 y.o. female  DOB 15-Jul-1940  MRN [id number].  Admission date:  09/24/2014  Admitting Physician  Eduard ClosArshad N Kakrakandy, MD  Discharge Date:  09/26/2014   Primary MD  Doreatha MartinVELAZQUEZ,GRETCHEN, MD  Recommendations for primary care physician for things to follow:   Repeat CBC, CMP next visit.   Admission Diagnosis  SIRS (systemic inflammatory response syndrome) [A41.9] Elevated lactic acid level [E87.2] Abdominal pain [R10.9]   Discharge Diagnosis  SIRS (systemic inflammatory response syndrome) [A41.9] Elevated lactic acid level [E87.2] Abdominal pain [R10.9]    Principal Problem:   Sepsis Active Problems:   Abdominal pain   Diabetes mellitus type 2, controlled   Essential hypertension   Elevated lactic acid level   SIRS (systemic inflammatory response syndrome)      Past Medical History  Diagnosis Date  . Thyroid disease   . Hypertension   . Diabetes mellitus without complication   . Gout     Past Surgical History  Procedure Laterality Date  . Cholecystectomy    . Abdominal hysterectomy         History of present illness and  Hospital Course:     Kindly see H&P for history of present illness and admission details, please review complete Labs, Consult reports and Test reports for all details in brief  HPI  from the history and physical done on the day of admission  Anna Byrd is a 74 y.o. female with history of diabetes mellitus type 2, hypothyroidism, gout, hypertension presents to the ER because of persistent nausea and vomiting with abdominal pain. Patient states her symptoms are at 9:30 AM and 1 hour prior to start of symptoms patient had crab meat. Patient's abdominal pain is mostly in the upper quadrants cramping in nature. Denies any diarrhea. Patient had fever and  chills. In the ER patient was found to be febrile tachycardic with lactic acid level elevated. CT abdomen and pelvis was unremarkable. Patient has been admitted for sepsis secondary to intra-abdominal source. Patient denies any chest pain shortness of breath productive cough. Denies any recent travel or sick contacts. Patient states her husband ate the same crab meat but has not had any symptoms.  Hospital Course    1. Gastroenteritis induced dehydration and elevated lactic acid. Do not think she was septic, nontoxic appearing, afebrile, has rapidly improved and now completely symptom free. She did have some seafood after which she got sick. Much improved after  Cipro, Flagyl and IV fluids, completely symptom free has tolerated diet without any further symptoms. We'll discharge home on few more days of oral Cipro and Flagyl. Request PCP to repeat CBC, CMP in a week. Discontinued Glucophage due to elevated lactic as above.     2. DM type II. Discontinue metformin due to elevated lactic acid at admission. Resume home insulin regimen.  CBG (last 3)   Recent Labs (last 2 labs)      Recent Labs  09/24/14 2220 09/25/14 0749  GLUCAP 148* 152*      3.  Hypertension. On Norvasc and hydralazine which will be continued.   4. GERD. On PPI.   5. Gout. Continue allopurinol.   6. ARF. From dehydration due to #1 above. Resolved with IV fluids.   7. Hypokalemia and hypomagnesemia. Both replaced & stable.   8. Mildly elevated LFTs. Likely Due to #1 above. CT scan abdomen pelvis stable, symptom-free, no right upper quadrant tenderness. Repeat CMP by PCP in a week. If still elevated will request one time outpatient GI follow-up         Discharge Condition: Stable   Follow UP  Follow-up Information    Follow up with [facility], MD. Schedule an appointment as soon as possible for a visit in 1 week.   Specialty:  Internal Medicine   Contact information:   [address]  [facility] INTERNAL MEDICINE [place] [address]         Discharge Instructions  and  Discharge Medications      Discharge Instructions    Discharge instructions    Complete by:  As directed   Follow with Primary MD  in 7 days   Get CBC, CMP, 2 view Chest X ray checked  by Primary MD next visit.    Activity: As tolerated with Full fall precautions use walker/cane & assistance as needed   Disposition Home     Diet: Heart Healthy Low Carb.  For Heart failure patients - Check your Weight same time everyday, if you gain over 2 pounds, or you develop in leg swelling, experience more shortness of breath or chest pain, call your Primary MD immediately. Follow Cardiac Low Salt Diet and 1.5 lit/day fluid restriction.   On your next visit with your primary care physician please Get Medicines reviewed and adjusted.   Please request your Prim.MD to go over all Hospital Tests and Procedure/Radiological results at the follow up, please get all Hospital records sent to your Prim MD by signing hospital release before you go home.   If you experience worsening of your admission symptoms, develop shortness of breath, life threatening emergency, suicidal or homicidal thoughts you must seek medical attention immediately by calling 911 or calling your MD immediately  if symptoms less severe.  You Must read complete instructions/literature along with all the possible adverse reactions/side effects for all the Medicines you take and that have been prescribed to you. Take any new Medicines after you have completely understood and accpet all the possible adverse reactions/side effects.   Do not drive, operating heavy machinery, perform activities at heights, swimming or participation in water activities or provide baby sitting services if your were admitted for syncope or siezures until you have seen by Primary MD or a Neurologist and advised to do so again.  Do not drive when taking Pain medications.      Do not take more than prescribed Pain, Sleep and Anxiety Medications  Special Instructions: If you have smoked or chewed Tobacco  in the last 2 yrs please stop smoking, stop any regular Alcohol  and or any Recreational drug use.  Wear Seat belts while driving.   Please note  You were cared for by a hospitalist during your hospital stay. If you have any questions about your discharge medications or the care you received while you were in the hospital after you are discharged, you can call the unit and asked to speak with the hospitalist on call if the hospitalist that took care of you is not available. Once you are discharged, your primary care  physician will handle any further medical issues. Please note that NO REFILLS for any discharge medications will be authorized once you are discharged, as it is imperative that you return to your primary care physician (or establish a relationship with a primary care physician if you do not have one) for your aftercare needs so that they can reassess your need for medications and monitor your lab values.     Increase activity slowly    Complete by:  As directed             Medication List    STOP taking these medications        metFORMIN 1000 MG tablet  Commonly known as:  GLUCOPHAGE      TAKE these medications        allopurinol 300 MG tablet  Commonly known as:  ZYLOPRIM  Take 150 mg by mouth daily.     amLODipine 10 MG tablet  Commonly known as:  NORVASC  Take 1 tablet by mouth daily.     CINNAMON PO  Take 1 tablet by mouth daily.     ciprofloxacin 500 MG tablet  Commonly known as:  CIPRO  Take 1 tablet (500 mg total) by mouth 2 (two) times daily.     doxazosin 2 MG tablet  Commonly known as:  CARDURA  Take 2 mg by mouth at bedtime.     FISH OIL PO  Take 1 capsule by mouth 2 (two) times daily.     hydrALAZINE 50 MG tablet  Commonly known as:  APRESOLINE  Take 100 mg by mouth 3 (three) times daily.     levothyroxine  137 MCG tablet  Commonly known as:  SYNTHROID, LEVOTHROID  Take 1 tablet by mouth daily.     loratadine 10 MG tablet  Commonly known as:  CLARITIN  Take 10 mg by mouth daily.     metroNIDAZOLE 500 MG tablet  Commonly known as:  FLAGYL  Take 1 tablet (500 mg total) by mouth 3 (three) times daily.     triamterene-hydrochlorothiazide 37.5-25 MG per tablet  Commonly known as:  MAXZIDE-25  Take 1 tablet by mouth daily.     VITAMIN E PO  Take 1 tablet by mouth daily.          Diet and Activity recommendation: See Discharge Instructions above   Consults obtained - none   Major procedures and Radiology Reports - PLEASE review detailed and final reports for all details, in brief -       Ct Abdomen Pelvis W Contrast  09/24/2014   CLINICAL DATA:  Upper abdominal pain, nausea and vomiting  EXAM: CT ABDOMEN AND PELVIS WITH CONTRAST  TECHNIQUE: Multidetector CT imaging of the abdomen and pelvis was performed using the standard protocol following bolus administration of intravenous contrast.  CONTRAST:  OMNIPAQUE IOHEXOL 300 MG/ML  SOLN  COMPARISON:  None.  FINDINGS: Lower chest: Lung bases are clear. Nodularity in the right central breast is partly imaged.  Hepatobiliary: Cholecystectomy clips are noted. Hepatic hypodensity suggests steatosis without focal abnormality or intrahepatic ductal dilatation.  Pancreas: Normal  Spleen: Normal  Adrenals/Urinary Tract: Adrenal glands are normal. 5 mm too small to characterize right mid renal cortical hypodense lesions are identified images 40 and 41, respectively, statistically most likely cysts. No hydroureteronephrosis. No radiopaque renal, ureteral, or bladder calculus. The bladder is decompressed but unremarkable.  Stomach/Bowel: Appendix not identified but no secondary evidence for acute appendicitis is identified. No bowel wall thickening or focal  segmental dilatation is identified.  Vascular/Lymphatic: Mild atheromatous aortic calcification  without aneurysm. No lymphadenopathy.  Reproductive: Uterus and ovaries presumed surgically absent, not visualized. No adnexal mass.  Other: No free air or fluid.  Musculoskeletal: Lumbar spine disc degenerative change is identified, most prominent at L4-L5. No acute osseous abnormality.  IMPRESSION: No acute intra-abdominal or pelvic pathology.   Electronically Signed   By: Christiana Pellant M.D.   On: 09/24/2014 16:34   Dg Chest Port 1 View  09/24/2014   CLINICAL DATA:  Acute onset of abdominal cramping and emesis 1 hr ago  EXAM: PORTABLE CHEST - 1 VIEW  COMPARISON:  None.  FINDINGS: A single AP portable view of the chest demonstrates no focal airspace consolidation or alveolar edema. The lungs are grossly clear. There is no large effusion or pneumothorax. Cardiac and mediastinal contours appear unremarkable.  IMPRESSION: No active disease.   Electronically Signed   By: Ellery Plunk M.D.   On: 09/24/2014 18:17    Micro Results      Recent Results (from the past 240 hour(s))  Blood Culture (routine x 2)     Status: None (Preliminary result)   Collection Time: 09/24/14  5:19 PM  Result Value Ref Range Status   Specimen Description BLOOD RIGHT HAND  Final   Special Requests BOTTLES DRAWN AEROBIC AND ANAEROBIC 5CC  Final   Culture   Final           BLOOD CULTURE RECEIVED NO GROWTH TO DATE CULTURE WILL BE HELD FOR 5 DAYS BEFORE ISSUING A FINAL NEGATIVE REPORT Performed at [facility]    Report Status PENDING  Incomplete  Blood Culture (routine x 2)     Status: None (Preliminary result)   Collection Time: 09/24/14  5:25 PM  Result Value Ref Range Status   Specimen Description RIGHT ANTECUBITAL  Final   Special Requests BOTTLES DRAWN AEROBIC AND ANAEROBIC 6CC  Final   Culture   Final           BLOOD CULTURE RECEIVED NO GROWTH TO DATE CULTURE WILL BE HELD FOR 5 DAYS BEFORE ISSUING A FINAL NEGATIVE REPORT Performed at [facility]    Report Status PENDING  Incomplete  Urine  culture     Status: None   Collection Time: 09/24/14  5:40 PM  Result Value Ref Range Status   Specimen Description URINE, CLEAN CATCH  Final   Special Requests NONE  Final   Colony Count NO GROWTH Performed at [facility]   Final   Culture NO GROWTH Performed at [facility]   Final   Report Status 09/25/2014 FINAL  Final       Today   Subjective:   Anna Byrd today has no headache,no chest abdominal pain,no new weakness tingling or numbness, feels much better wants to go home today.    Objective:   Blood pressure 129/75, pulse 88, temperature 98.3 F (36.8 C), temperature source Oral, resp. rate 18, height 5.2" (0.132 m), weight 98.3 kg (216 lb 11.4 oz), SpO2 97 %.   Intake/Output Summary (Last 24 hours) at 09/26/14 0831 Last data filed at 09/26/14 0600  Gross per 24 hour  Intake 2747.5 ml  Output   3300 ml  Net -552.5 ml    Exam Awake Alert, Oriented x 3, No new F.N deficits, Normal affect .AT,PERRAL Supple Neck,No JVD, No cervical lymphadenopathy appriciated.  Symmetrical Chest wall movement, Good air movement bilaterally, CTAB RRR,No Gallops,Rubs or new Murmurs, No Parasternal  Heave +ve B.Sounds, Abd Soft, Non tender, No organomegaly appriciated, No rebound -guarding or rigidity. No Cyanosis, Clubbing or edema, No new Rash or bruise  Data Review   CBC w Diff: Lab Results  Component Value Date   WBC 9.6 09/26/2014   HGB 11.0* 09/26/2014   HCT 32.5* 09/26/2014   PLT 265 09/26/2014   LYMPHOPCT 20 09/25/2014   MONOPCT 8 09/25/2014   EOSPCT 0 09/25/2014   BASOPCT 0 09/25/2014    CMP: Lab Results  Component Value Date   NA 140 09/26/2014   K 3.7 09/26/2014   CL 108 09/26/2014   CO2 23 09/26/2014   BUN 8 09/26/2014   CREATININE 0.68 09/26/2014   PROT 6.7 09/26/2014   ALBUMIN 3.4* 09/26/2014   BILITOT 0.6 09/26/2014   ALKPHOS 81 09/26/2014   AST 52* 09/26/2014   ALT 57* 09/26/2014  .   Total Time in preparing paper  work, data evaluation and todays exam - 35 minutes  Leroy SeaSINGH,[name] M.D on 09/26/2014 at 8:31 AM  [facility]   Office  [phone]

## 2014-09-26 NOTE — Discharge Instructions (Signed)
Follow with Primary MD in 7 days  ° °Get CBC, CMP, 2 view Chest X ray checked  by Primary MD next visit.  ° ° °Activity: As tolerated with Full fall precautions use walker/cane & assistance as needed ° ° °Disposition Home  ° ° °Diet: Heart Healthy Low Carb. ° °For Heart failure patients - Check your Weight same time everyday, if you gain over 2 pounds, or you develop in leg swelling, experience more shortness of breath or chest pain, call your Primary MD immediately. Follow Cardiac Low Salt Diet and 1.5 lit/day fluid restriction. ° ° °On your next visit with your primary care physician please Get Medicines reviewed and adjusted. ° ° °Please request your Prim.MD to go over all Hospital Tests and Procedure/Radiological results at the follow up, please get all Hospital records sent to your Prim MD by signing hospital release before you go home. ° ° °If you experience worsening of your admission symptoms, develop shortness of breath, life threatening emergency, suicidal or homicidal thoughts you must seek medical attention immediately by calling 911 or calling your MD immediately  if symptoms less severe. ° °You Must read complete instructions/literature along with all the possible adverse reactions/side effects for all the Medicines you take and that have been prescribed to you. Take any new Medicines after you have completely understood and accpet all the possible adverse reactions/side effects.  ° °Do not drive, operating heavy machinery, perform activities at heights, swimming or participation in water activities or provide baby sitting services if your were admitted for syncope or siezures until you have seen by Primary MD or a Neurologist and advised to do so again. ° °Do not drive when taking Pain medications.  ° ° °Do not take more than prescribed Pain, Sleep and Anxiety Medications ° °Special Instructions: If you have smoked or chewed Tobacco  in the last 2 yrs please stop smoking, stop any regular Alcohol  and  or any Recreational drug use. ° °Wear Seat belts while driving. ° ° °Please note ° °You were cared for by a hospitalist during your hospital stay. If you have any questions about your discharge medications or the care you received while you were in the hospital after you are discharged, you can call the unit and asked to speak with the hospitalist on call if the hospitalist that took care of you is not available. Once you are discharged, your primary care physician will handle any further medical issues. Please note that NO REFILLS for any discharge medications will be authorized once you are discharged, as it is imperative that you return to your primary care physician (or establish a relationship with a primary care physician if you do not have one) for your aftercare needs so that they can reassess your need for medications and monitor your lab values. ° °

## 2014-10-01 LAB — CULTURE, BLOOD (ROUTINE X 2)
CULTURE: NO GROWTH
CULTURE: NO GROWTH

## 2016-10-05 ENCOUNTER — Encounter (HOSPITAL_COMMUNITY): Payer: Self-pay

## 2016-10-05 ENCOUNTER — Emergency Department (HOSPITAL_COMMUNITY)
Admission: EM | Admit: 2016-10-05 | Discharge: 2016-10-05 | Disposition: A | Discharge status: Home / Self Care | Payer: Medicare HMO | Attending: Emergency Medicine | Admitting: Emergency Medicine

## 2016-10-05 ENCOUNTER — Emergency Department (HOSPITAL_COMMUNITY): Payer: Medicare HMO

## 2016-10-05 DIAGNOSIS — Z79899 Other long term (current) drug therapy: Secondary | ICD-10-CM | POA: Diagnosis not present

## 2016-10-05 DIAGNOSIS — N23 Unspecified renal colic: Secondary | ICD-10-CM

## 2016-10-05 DIAGNOSIS — N201 Calculus of ureter: Secondary | ICD-10-CM

## 2016-10-05 DIAGNOSIS — E119 Type 2 diabetes mellitus without complications: Secondary | ICD-10-CM | POA: Insufficient documentation

## 2016-10-05 DIAGNOSIS — N132 Hydronephrosis with renal and ureteral calculous obstruction: Secondary | ICD-10-CM | POA: Diagnosis not present

## 2016-10-05 DIAGNOSIS — R109 Unspecified abdominal pain: Secondary | ICD-10-CM | POA: Diagnosis present

## 2016-10-05 DIAGNOSIS — I10 Essential (primary) hypertension: Secondary | ICD-10-CM | POA: Diagnosis not present

## 2016-10-05 LAB — URINALYSIS, ROUTINE W REFLEX MICROSCOPIC
BILIRUBIN URINE: NEGATIVE
Glucose, UA: 50 mg/dL — AB
KETONES UR: NEGATIVE mg/dL
Leukocytes, UA: NEGATIVE
NITRITE: NEGATIVE
Protein, ur: 100 mg/dL — AB
SPECIFIC GRAVITY, URINE: 1.005 (ref 1.005–1.030)
pH: 7 (ref 5.0–8.0)

## 2016-10-05 MED ORDER — HYDROCODONE-ACETAMINOPHEN 5-325 MG PO TABS: 1.0000 | ORAL_TABLET | Freq: Four times a day (QID) | ORAL | 0 refills | Status: AC | PRN

## 2016-10-05 MED ORDER — HYDROCODONE-ACETAMINOPHEN 5-325 MG PO TABS
1.0000 | ORAL_TABLET | Freq: Once | ORAL | Status: AC
Start: 2016-10-05 — End: 2016-10-05
  Administered 2016-10-05: 1 via ORAL
  Filled 2016-10-05: qty 1

## 2016-10-05 MED ORDER — ONDANSETRON 8 MG PO TBDP
8.0000 mg | ORAL_TABLET | Freq: Once | ORAL | Status: AC
  Administered 2016-10-05: 8 mg via ORAL
  Filled 2016-10-05: qty 1

## 2016-10-05 NOTE — ED Provider Notes (Signed)
[facility]-EMERGENCY DEPT Provider Note   CSN: [id number] Arrival date & time: 10/05/16  0214  By signing my name below, I, Elder Negus, attest that this documentation has been prepared under the direction and in the presence of Zadie Rhine, MD. Electronically Signed: Elder Negus, Scribe. 10/05/16. 3:19 AM.   History   Chief Complaint Chief Complaint  Patient presents with  . Flank Pain    HPI Anna Byrd is a 76 y.o. female with history of HTN, DM, and hypothyroidism who presents to the ED with flank pain. This patient was in her usual health until 5 hours ago when she woke from sleep with L flank pain that has been constant. Pain is moderate. She is reporting some urinary frequency. However no dysuria or frank hematuria. No fever or cough. No history of nephrolithiasis. No abdominal pain or vomiting. No chest pain or dyspnea. No trauma. No focal weaknesses or decreased sensation.  The history is provided by the patient. No language interpreter was used.  Flank Pain  This is a new problem. The current episode started 3 to 5 hours ago. The problem occurs constantly. The problem has not changed since onset.Pertinent negatives include no chest pain, no abdominal pain and no shortness of breath.    Past Medical History:  Diagnosis Date  . Diabetes mellitus without complication (HCC)   . Gout   . Hypertension   . Thyroid disease     Patient Active Problem List   Diagnosis Date Noted  . Elevated lactic acid level   . SIRS (systemic inflammatory response syndrome) (HCC)   . Abdominal pain 09/24/2014  . Sepsis (HCC) 09/24/2014  . Diabetes mellitus type 2, controlled (HCC) 09/24/2014  . Essential hypertension 09/24/2014    Past Surgical History:  Procedure Laterality Date  . ABDOMINAL HYSTERECTOMY    . CHOLECYSTECTOMY      OB History    No data available       Home Medications    Prior to Admission medications   Medication Sig Start Date End Date Taking?  Authorizing Provider  allopurinol (ZYLOPRIM) 300 MG tablet Take 150 mg by mouth daily. 08/09/14   [provider]  amLODipine (NORVASC) 10 MG tablet Take 1 tablet by mouth daily. 07/09/14   [provider]  CINNAMON PO Take 1 tablet by mouth daily.    [provider]  ciprofloxacin (CIPRO) 500 MG tablet Take 1 tablet (500 mg total) by mouth 2 (two) times daily. 09/26/14   Leroy Sea, MD  doxazosin (CARDURA) 2 MG tablet Take 2 mg by mouth at bedtime.  08/22/14   [provider]  hydrALAZINE (APRESOLINE) 50 MG tablet Take 100 mg by mouth 3 (three) times daily.  08/07/14   [provider]  levothyroxine (SYNTHROID, LEVOTHROID) 137 MCG tablet Take 1 tablet by mouth daily. 07/06/14   [provider]  loratadine (CLARITIN) 10 MG tablet Take 10 mg by mouth daily.    [provider]  metroNIDAZOLE (FLAGYL) 500 MG tablet Take 1 tablet (500 mg total) by mouth 3 (three) times daily. 09/26/14   Leroy Sea, MD  Omega-3 Fatty Acids (FISH OIL PO) Take 1 capsule by mouth 2 (two) times daily.    [provider]  triamterene-hydrochlorothiazide (MAXZIDE-25) 37.5-25 MG per tablet Take 1 tablet by mouth daily. 07/09/14   [provider]  VITAMIN E PO Take 1 tablet by mouth daily.    [provider]    Family History Family History  Problem  Relation Age of Onset  . Diabetes Mellitus II Mother   . Hypertension Mother   . Stroke Sister     Social History Social History  Substance Use Topics  . Smoking status: Never Smoker  . Smokeless tobacco: Never Used  . Alcohol use No     Allergies   Other   Review of Systems Review of Systems  Respiratory: Negative for shortness of breath.   Cardiovascular: Negative for chest pain.  Gastrointestinal: Negative for abdominal pain, nausea and vomiting.  Genitourinary: Positive for flank pain and frequency. Negative for dysuria and hematuria.  Neurological: Negative for  weakness and numbness.  All other systems reviewed and are negative.    Physical Exam Updated Vital Signs BP (!) 190/118 (BP Location: Left Arm)   Pulse (!) 104   Temp 98.8 F (37.1 C) (Oral)   Resp (!) 22   Ht 5\' 2"  (1.575 m)   Wt 216 lb (98 kg)   SpO2 99%   BMI 39.51 kg/m   Physical Exam CONSTITUTIONAL: Well developed/well nourished HEAD: Normocephalic/atraumatic EYES: EOMI ENMT: Mucous membranes moist NECK: supple no meningeal signs SPINE/BACK:entire spine nontender CV: S1/S2 noted, no murmurs/rubs/gallops noted LUNGS: Lungs are clear to auscultation bilaterally, no apparent distress ABDOMEN: soft, nontender, no rebound or guarding, bowel sounds noted throughout abdomen GU: L cva tenderness NEURO: Pt is awake/alert/appropriate, moves all extremitiesx4.  No facial droop.   EXTREMITIES: pulses normal/equal, full ROM SKIN: warm, color normal PSYCH: no abnormalities of mood noted, alert and oriented to situation  ED Treatments / Results  Labs (all labs ordered are listed, but only abnormal results are displayed) Labs Reviewed  URINALYSIS, ROUTINE W REFLEX MICROSCOPIC - Abnormal; Notable for the following:       Result Value   Color, Urine COLORLESS (*)    Glucose, UA 50 (*)    Hgb urine dipstick SMALL (*)    Protein, ur 100 (*)    All other components within normal limits    EKG  EKG Interpretation None       Radiology Ct Renal Stone Study  Addendum Date: 10/05/2016   ADDENDUM REPORT: 10/05/2016 04:08 ADDENDUM: Addendum created to add an additional finding to the impression. Nodular right breast tissue, possible increased from CT in 2016. Recommend up-to-date mammography. Electronically Signed   By: Rubye Oaks M.D.   On: 10/05/2016 04:08   Result Date: 10/05/2016 CLINICAL DATA:  Awoke with left flank pain. EXAM: CT ABDOMEN AND PELVIS WITHOUT CONTRAST TECHNIQUE: Multidetector CT imaging of the abdomen and pelvis was performed following the standard  protocol without IV contrast. COMPARISON:  CT 09/24/2014 FINDINGS: Lower chest: The lung bases are clear. Nodular breast tissue on the right is slightly more prominent than on prior CT. Hepatobiliary: Decreased hepatic density consistent with steatosis. The liver is enlarged measuring 20 cm craniocaudal. Clips in the gallbladder fossa postcholecystectomy. No biliary dilatation. Pancreas: No ductal dilatation or inflammation. Spleen: Normal in size without focal abnormality. Adrenals/Urinary Tract: No adrenal nodule. Obstructing 6 x 4 mm stone in the left proximal ureter (at the level of L4) with moderate proximal hydroureteronephrosis and perinephric edema. No additional nonobstructing left stones. Probable punctate nonobstructing stone in the mid right kidney. No right hydronephrosis. Tiny subcentimeter hyperdense lesion in the mid right kidney is unchanged in size from prior CT, incompletely characterized. Urinary bladder is minimally distended, no bladder stone or wall thickening. Stomach/Bowel: Stomach distended with ingested contents. No bowel obstruction, wall thickening or inflammation. Appendix tentatively identified abutting the  inferior liver tip. Small volume of stool throughout the colon without colonic wall thickening. Diverticulosis in the distal descending and sigmoid, no acute inflammation. Vascular/Lymphatic: Mild aortic atherosclerosis. No evidence of adenopathy. Reproductive: Status post hysterectomy. No adnexal masses. Other: Tiny fat containing umbilical hernia. No free air, free fluid, or intra-abdominal fluid collection. Musculoskeletal: Degenerative change in the lumbar spine most prominent at L4-L5 with degenerative disc disease and facet arthropathy. IMPRESSION: 1. Obstructing 6 x 4 mm stone in the left proximal ureter with moderate proximal hydroureteronephrosis and perinephric edema. 2. Punctate nonobstructing stone in the mid right kidney. 3. Hepatic steatosis. 4. Colonic diverticulosis  without acute inflammation. 5. Aortic atherosclerosis. Electronically Signed: By: Rubye OaksMelanie  Ehinger M.D. On: 10/05/2016 04:00    Procedures Procedures (including critical care time)  Medications Ordered in ED Medications  ondansetron (ZOFRAN-ODT) disintegrating tablet 8 mg (8 mg Oral Given 10/05/16 0300)  HYDROcodone-acetaminophen (NORCO/VICODIN) 5-325 MG per tablet 1 tablet (1 tablet Oral Given 10/05/16 0300)     Initial Impression / Assessment and Plan / ED Course  I have reviewed the triage vital signs and the nursing notes.  Pertinent labs   results that were available during my care of the patient were reviewed by me and considered in my medical decision making (see chart for details).     4:39 AM Pt stable Feels improved Left ureteral stone noted Pt would like to be discharged Radiology notes reviewed - recommended her to have mammogram - pt reports she has one scheduled later this summer Discussed strict ER return precautions Referred to urology She is already on cardura, will not start flomax   Final Clinical Impressions(s) / ED Diagnoses   Final diagnoses:  Left ureteral stone  Ureteral colic    New Prescriptions New Prescriptions   HYDROCODONE-ACETAMINOPHEN (NORCO/VICODIN) 5-325 MG TABLET    Take 1 tablet by mouth every 6 (six) hours as needed for severe pain.  I personally performed the services described in this documentation, which was scribed in my presence. The recorded information has been reviewed and is accurate.       [name], Tabatha Razzano, MD 10/05/16 [phone]

## 2016-10-05 NOTE — ED Notes (Addendum)
Pt reports having left "back pain" 6/10 described as sharp and constant. Pt is alert and oriented x 4 and is verbal responsive. Pt has some associated nausea. Pt denies vomiting, fever and diarrhea.

## 2016-10-05 NOTE — ED Triage Notes (Signed)
States about 2300 pm last night pain in left flank area with nausea states just treated for UTI no fever noted.

## 2016-10-06 ENCOUNTER — Encounter (HOSPITAL_COMMUNITY): Payer: Self-pay | Admitting: *Deleted

## 2016-10-06 ENCOUNTER — Emergency Department (HOSPITAL_COMMUNITY): Payer: Medicare HMO

## 2016-10-06 ENCOUNTER — Emergency Department (HOSPITAL_COMMUNITY)
Admission: EM | Admit: 2016-10-06 | Discharge: 2016-10-07 | Disposition: A | Discharge status: Home / Self Care | Payer: Medicare HMO | Attending: Emergency Medicine | Admitting: Emergency Medicine

## 2016-10-06 DIAGNOSIS — E119 Type 2 diabetes mellitus without complications: Secondary | ICD-10-CM | POA: Diagnosis not present

## 2016-10-06 DIAGNOSIS — N179 Acute kidney failure, unspecified: Secondary | ICD-10-CM

## 2016-10-06 DIAGNOSIS — Z79899 Other long term (current) drug therapy: Secondary | ICD-10-CM | POA: Insufficient documentation

## 2016-10-06 DIAGNOSIS — Z7984 Long term (current) use of oral hypoglycemic drugs: Secondary | ICD-10-CM | POA: Diagnosis not present

## 2016-10-06 DIAGNOSIS — N23 Unspecified renal colic: Secondary | ICD-10-CM

## 2016-10-06 DIAGNOSIS — R109 Unspecified abdominal pain: Secondary | ICD-10-CM

## 2016-10-06 DIAGNOSIS — N201 Calculus of ureter: Secondary | ICD-10-CM | POA: Insufficient documentation

## 2016-10-06 DIAGNOSIS — I10 Essential (primary) hypertension: Secondary | ICD-10-CM | POA: Diagnosis not present

## 2016-10-06 LAB — CBC WITH DIFFERENTIAL/PLATELET
Basophils Absolute: 0 10*3/uL (ref 0.0–0.1)
Basophils Relative: 0 %
Eosinophils Absolute: 0 10*3/uL (ref 0.0–0.7)
Eosinophils Relative: 0 %
HCT: 40.3 % (ref 36.0–46.0)
Hemoglobin: 13.8 g/dL (ref 12.0–15.0)
LYMPHS ABS: 2.2 10*3/uL (ref 0.7–4.0)
Lymphocytes Relative: 19 %
MCH: 31.8 pg (ref 26.0–34.0)
MCHC: 34.2 g/dL (ref 30.0–36.0)
MCV: 92.9 fL (ref 78.0–100.0)
MONOS PCT: 6 %
Monocytes Absolute: 0.7 10*3/uL (ref 0.1–1.0)
Neutro Abs: 8.7 10*3/uL — ABNORMAL HIGH (ref 1.7–7.7)
Neutrophils Relative %: 75 %
Platelets: 337 10*3/uL (ref 150–400)
RBC: 4.34 MIL/uL (ref 3.87–5.11)
RDW: 13.3 % (ref 11.5–15.5)
WBC: 11.7 10*3/uL — ABNORMAL HIGH (ref 4.0–10.5)

## 2016-10-06 LAB — BASIC METABOLIC PANEL
Anion gap: 10 (ref 5–15)
BUN: 23 mg/dL — AB (ref 6–20)
CO2: 26 mmol/L (ref 22–32)
Calcium: 9.3 mg/dL (ref 8.9–10.3)
Chloride: 99 mmol/L — ABNORMAL LOW (ref 101–111)
Creatinine, Ser: 1.3 mg/dL — ABNORMAL HIGH (ref 0.44–1.00)
GFR calc Af Amer: 45 mL/min — ABNORMAL LOW (ref >60)
GFR, EST NON AFRICAN AMERICAN: 39 mL/min — AB (ref >60)
GLUCOSE: 164 mg/dL — AB (ref 65–99)
Potassium: 3.7 mmol/L (ref 3.5–5.1)
Sodium: 135 mmol/L (ref 135–145)

## 2016-10-06 LAB — URINALYSIS, ROUTINE W REFLEX MICROSCOPIC
Bacteria, UA: NONE SEEN
Bilirubin Urine: NEGATIVE
Glucose, UA: NEGATIVE mg/dL
Ketones, ur: NEGATIVE mg/dL
Leukocytes, UA: NEGATIVE
NITRITE: NEGATIVE
PH: 5 (ref 5.0–8.0)
Protein, ur: 30 mg/dL — AB
SPECIFIC GRAVITY, URINE: 1.006 (ref 1.005–1.030)

## 2016-10-06 MED ORDER — KETOROLAC TROMETHAMINE 15 MG/ML IJ SOLN
15.0000 mg | Freq: Once | INTRAMUSCULAR | Status: AC
  Administered 2016-10-06: 15 mg via INTRAVENOUS
  Filled 2016-10-06: qty 1

## 2016-10-06 NOTE — ED Triage Notes (Signed)
Pt c/o pain in the left lower abdomen and left flank area since Sunday night. Seen here yesterday, told she had a kidney stone. Pt says that she has been taken her Vicodin (last dose at 1530 today) and it is not helping the pain. Pt has also had nausea.

## 2016-10-06 NOTE — ED Notes (Signed)
Pt says that she did call the urologist and has an appt in the morning at 1030

## 2016-10-06 NOTE — ED Provider Notes (Addendum)
[facility]ER[facility] DEPT Provider Note   CSN: [id number] Arrival date & time: 10/06/16  1834     History   Chief Complaint Chief Complaint  Patient presents with  . Flank Pain    HPI Anna Byrd is a 76 y.o. female.  The history is provided by the patient.  Flank Pain  This is a recurrent problem. Episode onset: 2 days. The problem occurs constantly. The problem has been gradually worsening (fluctuating). Pertinent negatives include no chest pain, no abdominal pain and no headaches. Nothing aggravates the symptoms. Nothing relieves the symptoms. Treatments tried: norco and NSAIDs. The treatment provided mild relief.   Diagnosed with obstructing 6mm prox left uteral stone. Pain controlled and DC'd with pain meds.   Past Medical History:  Diagnosis Date  . Diabetes mellitus without complication (HCC)   . Gout   . Hypertension   . Thyroid disease     Patient Active Problem List   Diagnosis Date Noted  . Elevated lactic acid level   . SIRS (systemic inflammatory response syndrome) (HCC)   . Abdominal pain 09/24/2014  . Sepsis (HCC) 09/24/2014  . Diabetes mellitus type 2, controlled (HCC) 09/24/2014  . Essential hypertension 09/24/2014    Past Surgical History:  Procedure Laterality Date  . ABDOMINAL HYSTERECTOMY    . CHOLECYSTECTOMY      OB History    No data available       Home Medications    Prior to Admission medications   Medication Sig Start Date End Date Taking? Authorizing Provider  allopurinol (ZYLOPRIM) 300 MG tablet Take 150 mg by mouth at bedtime.  08/09/14  Yes [provider]  amLODipine (NORVASC) 10 MG tablet Take 1 tablet by mouth daily. 07/09/14  Yes [provider]  Cyanocobalamin (VITAMIN B-12 PO) Take 1 tablet by mouth daily.   Yes [provider]  doxazosin (CARDURA) 2 MG tablet Take 4 mg by mouth at bedtime.  08/22/14  Yes [provider]  hydrALAZINE (APRESOLINE) 50 MG tablet Take 100 mg by mouth 2 (two)  times daily.  08/07/14  Yes [provider]  HYDROcodone-acetaminophen (NORCO/VICODIN) 5-325 MG tablet Take 1 tablet by mouth every 6 (six) hours as needed for severe pain. 10/05/16  Yes [name], [name], MD  levothyroxine (SYNTHROID, LEVOTHROID) 150 MCG tablet Take 150 mcg by mouth daily before breakfast.  08/21/16  Yes [provider]  metFORMIN (GLUCOPHAGE) 1000 MG tablet Take 500 mg by mouth 2 (two) times daily with a meal.  08/06/15  Yes [provider]  Omega-3 Fatty Acids (FISH OIL PO) Take 1 capsule by mouth daily.    Yes [provider]  triamterene-hydrochlorothiazide (MAXZIDE-25) 37.5-25 MG per tablet Take 1 tablet by mouth daily. 07/09/14  Yes [provider]  VITAMIN E PO Take 1 tablet by mouth daily.   Yes [provider]  metroNIDAZOLE (FLAGYL) 500 MG tablet Take 1 tablet (500 mg total) by mouth 3 (three) times daily. Patient not taking: Reported on 10/06/2016 09/26/14   Leroy SeaSingh, [name], MD    Family History Family History  Problem Relation Age of Onset  . Diabetes Mellitus II Mother   . Hypertension Mother   . Stroke Sister     Social History Social History  Substance Use Topics  . Smoking status: Never Smoker  . Smokeless tobacco: Never Used  . Alcohol use No     Allergies   Other   Review of Systems Review of Systems  Cardiovascular: Negative for chest pain.  Gastrointestinal: Negative for abdominal pain.  Genitourinary: Positive for flank pain.  Neurological: Negative for headaches.   All other systems are reviewed and are negative for acute change except as noted in the HPI   Physical Exam Updated Vital Signs BP (!) 159/89 (BP Location: Right Arm)   Pulse 88   Temp 98.1 F (36.7 C) (Oral)   Resp 18   Ht 5\' 2"  (1.575 m)   Wt 98 kg (216 lb)   SpO2 94%   BMI 39.51 kg/m   Physical Exam  Constitutional: She is oriented to person, place, and time. She appears well-developed and well-nourished. No  distress.  HENT:  Head: Normocephalic and atraumatic.  Nose: Nose normal.  Eyes: Conjunctivae and EOM are normal. Pupils are equal, round, and reactive to light. Right eye exhibits no discharge. Left eye exhibits no discharge. No scleral icterus.  Neck: Normal range of motion. Neck supple.  Cardiovascular: Normal rate and regular rhythm.  Exam reveals no gallop and no friction rub.   No murmur heard. Pulmonary/Chest: Effort normal and breath sounds normal. No stridor. No respiratory distress. She has no rales.  Abdominal: Soft. She exhibits no distension. There is no tenderness.  Musculoskeletal: She exhibits no edema or tenderness.  Neurological: She is alert and oriented to person, place, and time.  Skin: Skin is warm and dry. No rash noted. She is not diaphoretic. No erythema.  Psychiatric: She has a normal mood and affect.  Vitals reviewed.    ED Treatments / Results  Labs (all labs ordered are listed, but only abnormal results are displayed) Labs Reviewed - No data to display  EKG  EKG Interpretation None       Radiology Ct Renal Stone Study  Addendum Date: 10/05/2016   ADDENDUM REPORT: 10/05/2016 04:08 ADDENDUM: Addendum created to add an additional finding to the impression. Nodular right breast tissue, possible increased from CT in 2016. Recommend up-to-date mammography. Electronically Signed   By: Rubye Oaks M.D.   On: 10/05/2016 04:08   Result Date: 10/05/2016 CLINICAL DATA:  Awoke with left flank pain. EXAM: CT ABDOMEN AND PELVIS WITHOUT CONTRAST TECHNIQUE: Multidetector CT imaging of the abdomen and pelvis was performed following the standard protocol without IV contrast. COMPARISON:  CT 09/24/2014 FINDINGS: Lower chest: The lung bases are clear. Nodular breast tissue on the right is slightly more prominent than on prior CT. Hepatobiliary: Decreased hepatic density consistent with steatosis. The liver is enlarged measuring 20 cm craniocaudal. Clips in the  gallbladder fossa postcholecystectomy. No biliary dilatation. Pancreas: No ductal dilatation or inflammation. Spleen: Normal in size without focal abnormality. Adrenals/Urinary Tract: No adrenal nodule. Obstructing 6 x 4 mm stone in the left proximal ureter (at the level of L4) with moderate proximal hydroureteronephrosis and perinephric edema. No additional nonobstructing left stones. Probable punctate nonobstructing stone in the mid right kidney. No right hydronephrosis. Tiny subcentimeter hyperdense lesion in the mid right kidney is unchanged in size from prior CT, incompletely characterized. Urinary bladder is minimally distended, no bladder stone or wall thickening. Stomach/Bowel: Stomach distended with ingested contents. No bowel obstruction, wall thickening or inflammation. Appendix tentatively identified abutting the inferior liver tip. Small volume of stool throughout the colon without colonic wall thickening. Diverticulosis in the distal descending and sigmoid, no acute inflammation. Vascular/Lymphatic: Mild aortic atherosclerosis. No evidence of adenopathy. Reproductive: Status post hysterectomy. No adnexal masses. Other: Tiny fat containing umbilical hernia. No free air, free fluid, or intra-abdominal fluid collection. Musculoskeletal: Degenerative change in the lumbar spine  most prominent at L4-L5 with degenerative disc disease and facet arthropathy. IMPRESSION: 1. Obstructing 6 x 4 mm stone in the left proximal ureter with moderate proximal hydroureteronephrosis and perinephric edema. 2. Punctate nonobstructing stone in the mid right kidney. 3. Hepatic steatosis. 4. Colonic diverticulosis without acute inflammation. 5. Aortic atherosclerosis. Electronically Signed: By: Rubye Oaks M.D. On: 10/05/2016 04:00    Procedures Procedures (including critical care time)  EMERGENCY DEPARTMENT US RENAL EXAM  "Study: Limited Retroperitoneal Ultrasound of Kidneys"  INDICATIONS: Flank pain Long and  short axis of both kidneys were obtained.   PERFORMED BY: Myself IMAGES ARCHIVED?: Yes LIMITATIONS: Body habitus VIEWS USED: Long axis and Short axis  INTERPRETATION: Left  Hydronephrosis moderate, No Kidney stone    Medications Ordered in ED Medications - No data to display   Initial Impression / Assessment and Plan / ED Course  I have reviewed the triage vital signs and the nursing notes.  Pertinent labs & imaging results that were available during my care of the patient were reviewed by me and considered in my medical decision making (see chart for details).     Uncontrolled left flank pain with with home narcotics and ibuprofen. known 6 mm proximal ureteral stone that was noted to be obstructing yesterday.   Bedside ultrasound revealed moderate left hydronephrosis.   Labs significant for acute renal insufficiency with an increase in her creatinine since yesterday from 0.69 to 1.3. We'll repeat the CT scan to identify the location of the stone. Stone positioning still in the same. Hydronephrosis unchanged. Given her renal insufficiency will discuss case with urology.   Urology will plan to see her tomorrow for further evaluation. Discussed plan with patient who was in agreement.  The patient is safe for discharge with strict return precautions.   Final Clinical Impressions(s) / ED Diagnoses   Final diagnoses:  Left flank pain  Ureteral colic  Ureterolithiasis  AKI (acute kidney injury) (HCC)   Disposition: Discharge  Condition: Good  I have discussed the results, Dx and Tx plan with the patient who expressed understanding and agree(s) with the plan. Discharge instructions discussed at great length. The patient was given strict return precautions who verbalized understanding of the instructions. No further questions at time of discharge.    New Prescriptions   No medications on file    Follow Up: [facility] [address] [place]  [place] [address] [phone]          Nira Conn, MD 10/07/16 [phone]

## 2018-03-19 IMAGING — CT CT RENAL STONE PROTOCOL
2 of 3 series · 16 of 46 positions shown, 18 images · non-contrast
Comparison: CT of the abdomen and pelvis performed 10/05/2016

CLINICAL DATA: Acute onset of left lower quadrant abdominal pain
and left flank pain. Nausea. Initial encounter.

EXAM:
CT ABDOMEN AND PELVIS WITHOUT CONTRAST
TECHNIQUE: Multidetector CT imaging of the abdomen and pelvis was performed
following the standard protocol without IV contrast.

[Series 4: lung · axial · 0.79mm/px · z∈[+1319,+1427]mm · 13 of 63 slices shown, 15 images]
[im 5/63  soft-tissue]
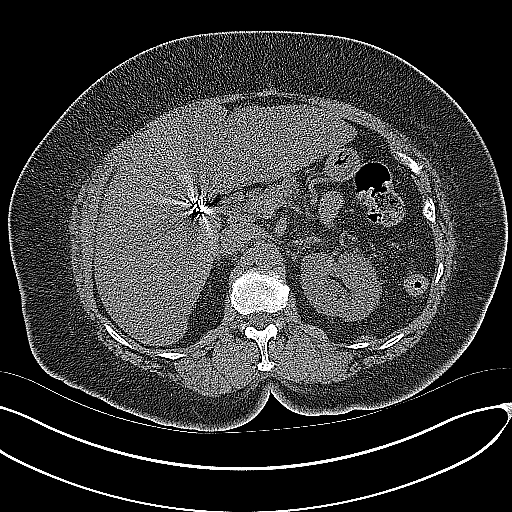
[im 5/63  bone]
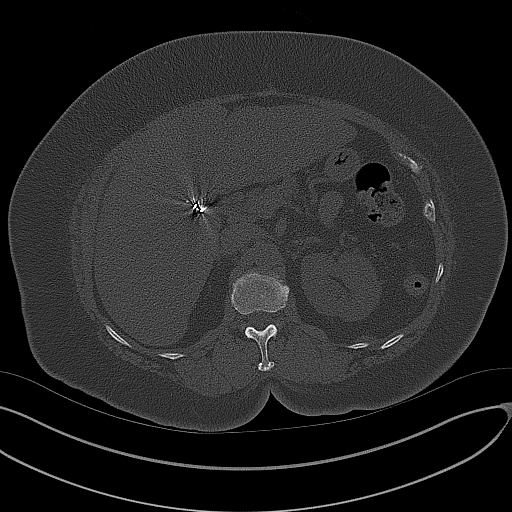
[im 9/63  soft-tissue]
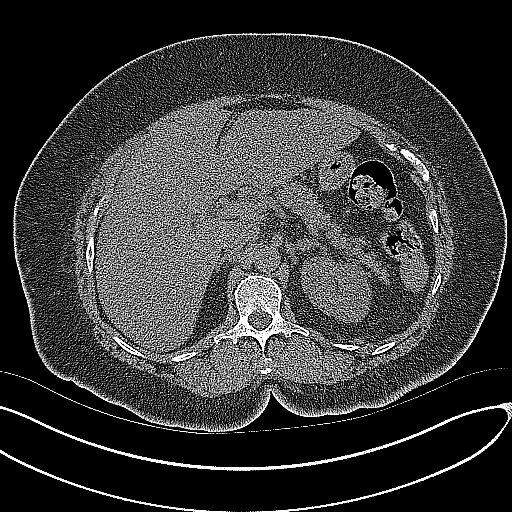
[im 13/63  soft-tissue]
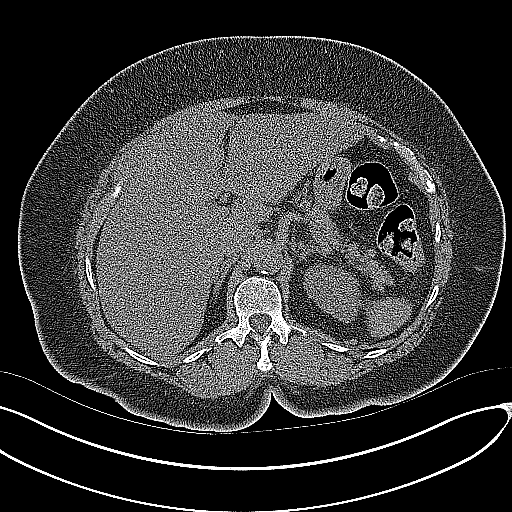
[im 19/63  soft-tissue]
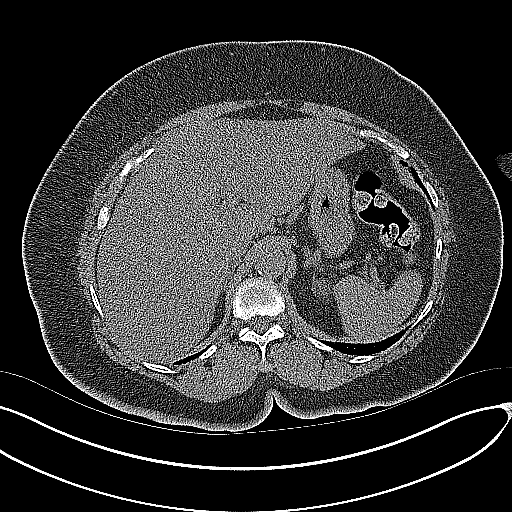
[im 23/63  soft-tissue]
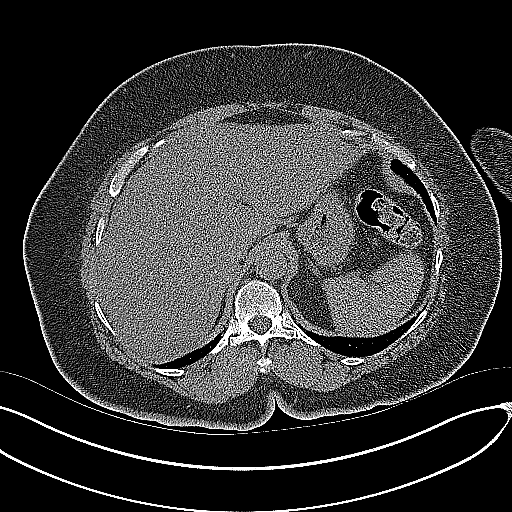
[im 27/63  soft-tissue]
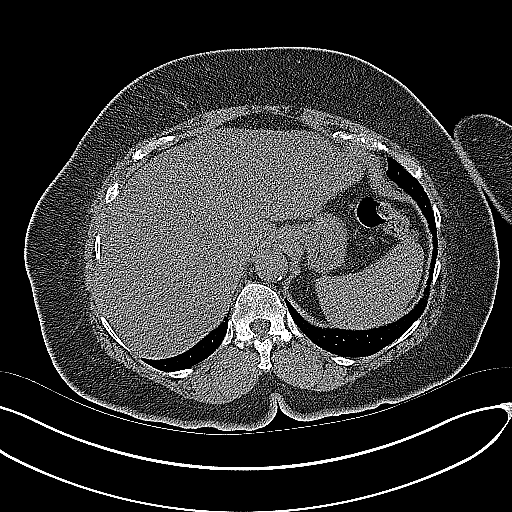
[im 33/63  soft-tissue]
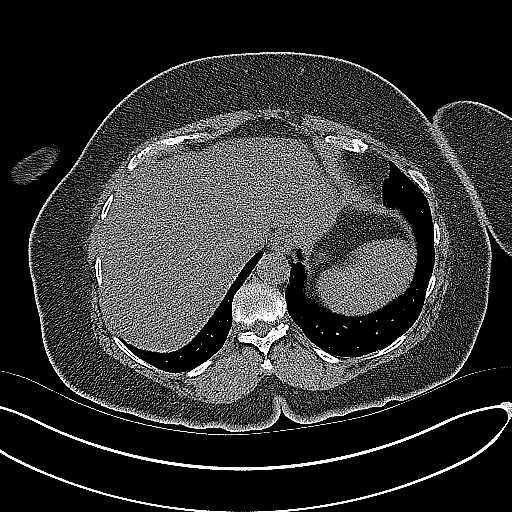
[im 37/63  soft-tissue]
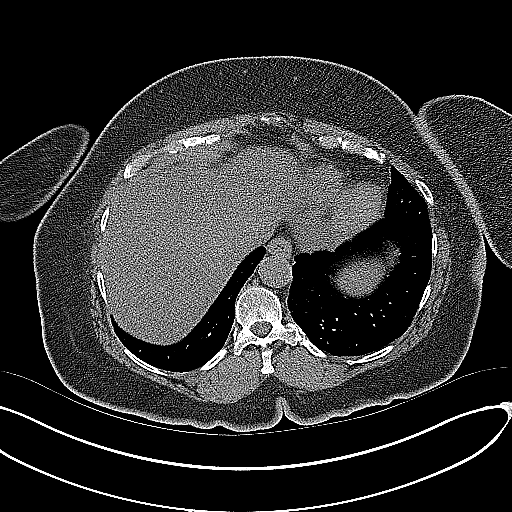
[im 41/63  soft-tissue]
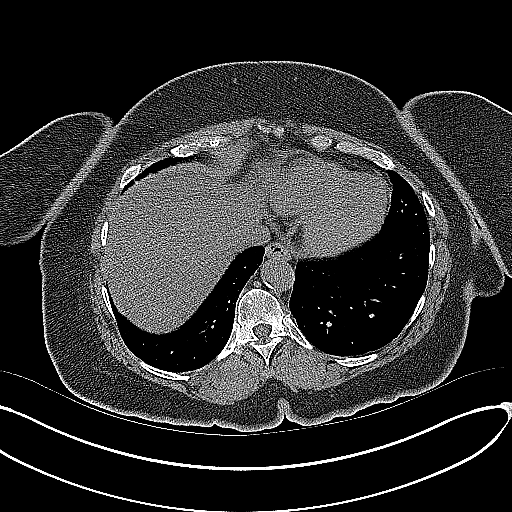
[im 41/63  bone]
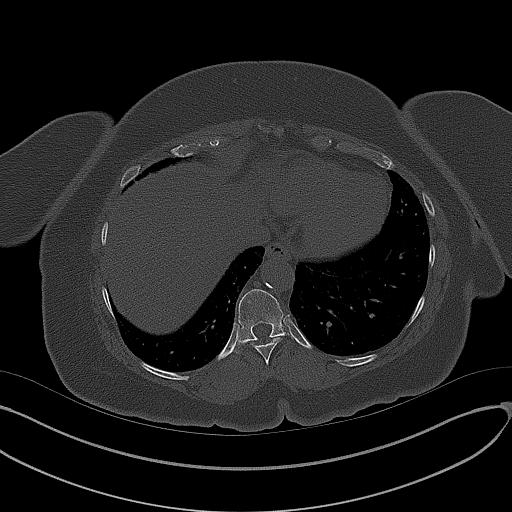
[im 45/63  soft-tissue]
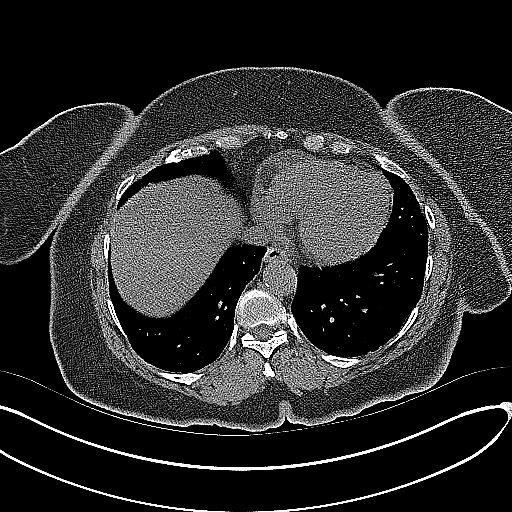
[im 51/63  soft-tissue]
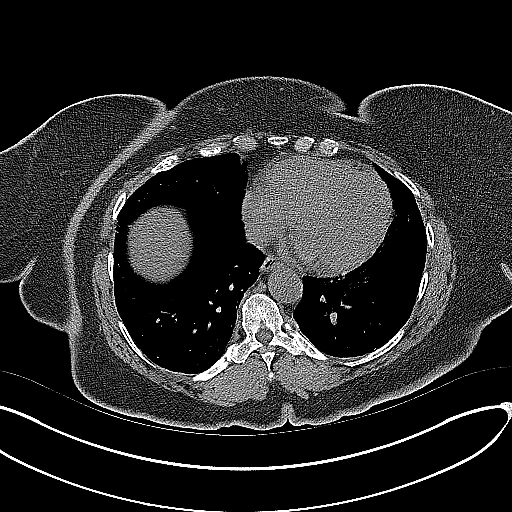
[im 55/63  soft-tissue]
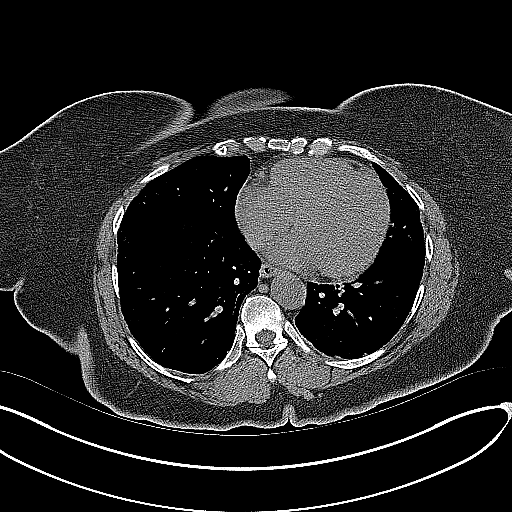
[im 59/63  soft-tissue]
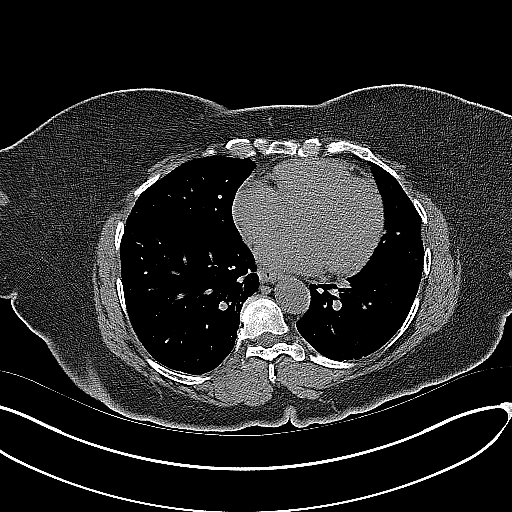

[Series 5: coronal · coronal · 0.77mm/px · 3 of 160 slices shown]
[im 54/160  soft-tissue]
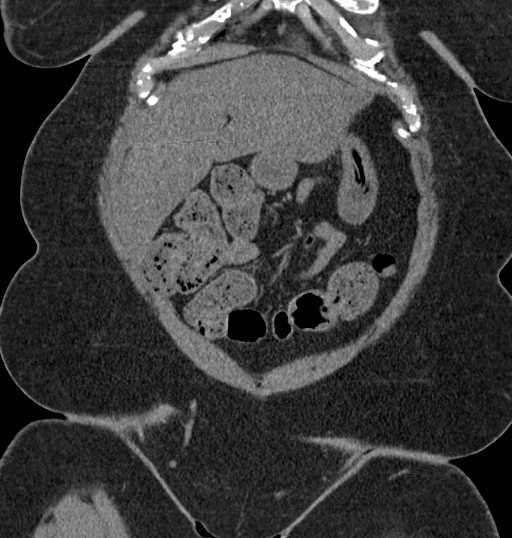
[im 71/160  soft-tissue]
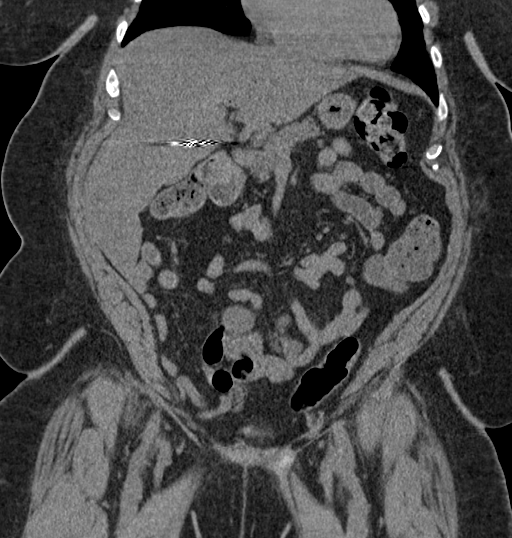
[im 89/160  soft-tissue]
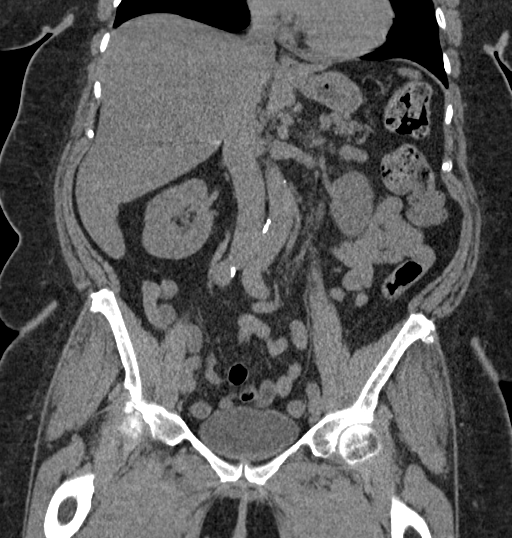

[16 of 46 positions shown; findings below may reference images not displayed]

FINDINGS: Lower chest: Minimal right basilar atelectasis is noted. The
visualized portions of the mediastinum are unremarkable.

Hepatobiliary: The liver is unremarkable in appearance. The patient
is status post cholecystectomy, with clips noted at the gallbladder
fossa. The common bile duct remains normal in caliber.

Pancreas: The pancreas is within normal limits.

Spleen: The spleen is unremarkable in appearance.

Adrenals/Urinary Tract: The adrenal glands are unremarkable in
appearance.

Mild left-sided hydronephrosis is noted, with prominence of the
proximal left ureter to the level of an obstructing 6 x 4 mm stone 5
cm below the left renal pelvis. A small right-sided hyperdense renal
cyst is noted. No nonobstructing renal stones are identified.

Stomach/Bowel: The stomach is unremarkable in appearance. The small
bowel is within normal limits. The appendix is normal in caliber,
without evidence of appendicitis.

Minimal diverticulosis is noted at the mid sigmoid colon, without
evidence of diverticulitis.

Vascular/Lymphatic: Scattered calcification is seen along the
abdominal aorta and its branches. The abdominal aorta is otherwise
grossly unremarkable. The inferior vena cava is grossly
unremarkable. No retroperitoneal lymphadenopathy is seen. No pelvic
sidewall lymphadenopathy is identified.

Reproductive: The bladder is mildly distended and grossly
unremarkable. The patient is status post hysterectomy. No suspicious
adnexal masses are seen. The right ovary is unremarkable in
appearance.

Other: No additional soft tissue abnormalities are seen.

Musculoskeletal: No acute osseous abnormalities are identified.
Endplate sclerotic change and vacuum phenomenon are noted at L4-L5,
with grade 1 anterolisthesis of L4 on L5, reflecting underlying
facet disease. The visualized musculature is unremarkable in
appearance.
IMPRESSION: 1. Mild left-sided hydronephrosis, with an obstructing 6 x 4 mm
stone noted at the proximal left ureter, 5 cm below the left renal
pelvis.
2. Small right-sided hyperdense renal cyst noted.
3. Minimal diverticulosis at the mid sigmoid colon, without evidence
of diverticulitis.
4. Scattered aortic atherosclerosis.
5. Mild degenerative change at L4-L5.

## 2019-07-03 ENCOUNTER — Ambulatory Visit: Payer: Medicare HMO | Attending: Internal Medicine

## 2019-07-03 DIAGNOSIS — Z23 Encounter for immunization: Secondary | ICD-10-CM | POA: Insufficient documentation

## 2019-07-03 NOTE — Progress Notes (Signed)
   Covid-19 Vaccination Clinic  Name:  Anna Byrd    MRN: [id number] DOB: 27-Jan-1941  07/03/2019  [name] was observed post Covid-19 immunization for 15 minutes without incidence. She was provided with Vaccine Information Sheet and instruction to access the V-Safe system.   [name] was instructed to call 911 with any severe reactions post vaccine: Marland Kitchen Difficulty breathing  . Swelling of your face and throat  . A fast heartbeat  . A bad rash all over your body  . Dizziness and weakness    Immunizations Administered    Name Date Dose VIS Date Route   Pfizer COVID-19 Vaccine 07/03/2019  2:55 PM 0.3 mL 04/28/2019 Intramuscular   Manufacturer: [employer], [employer]   Lot: [id number]   NDC: [address]-8

## 2019-07-25 ENCOUNTER — Ambulatory Visit: Payer: Medicare HMO | Attending: Internal Medicine

## 2019-07-25 DIAGNOSIS — Z23 Encounter for immunization: Secondary | ICD-10-CM | POA: Insufficient documentation

## 2019-07-25 NOTE — Progress Notes (Signed)
   Covid-19 Vaccination Clinic  Name:  Anna Byrd    MRN: [id number] DOB: [birth year]  07/25/2019  Anna Byrd was observed post Covid-19 immunization for 15 minutes without incident. She was provided with Vaccine Information Sheet and instruction to access the V-Safe system.   Anna Byrd was instructed to call 911 with any severe reactions post vaccine: Marland Kitchen Difficulty breathing  . Swelling of face and throat  . A fast heartbeat  . A bad rash all over body  . Dizziness and weakness   Immunizations Administered    Name Date Dose VIS Date Route   Pfizer COVID-19 Vaccine 07/25/2019  8:56 AM 0.3 mL 04/28/2019 Intramuscular   Manufacturer: [employer], [employer]   Lot: [id number]   NDC: [address]-9

## 2019-07-26 ENCOUNTER — Ambulatory Visit: Payer: Medicare HMO

## 2023-01-24 NOTE — Therapy (Unsigned)
OUTPATIENT PHYSICAL THERAPY LOWER EXTREMITY EVALUATION   Patient Name: Anna Byrd MRN: [id number] DOB:1940/12/06, 82 y.o., female Today's Date: 01/25/2023  END OF SESSION:  PT End of Session - 01/25/23 1058     Visit Number 1    Number of Visits 12    Date for PT Re-Evaluation 03/27/23    Authorization Type [employer] MCR    PT Start Time 0830    PT Stop Time 0915    PT Time Calculation (min) 45 min    Activity Tolerance Patient tolerated treatment well    Behavior During Therapy WFL for tasks assessed/performed             Past Medical History:  Diagnosis Date   Diabetes mellitus without complication (HCC)    Gout    Hypertension    Thyroid disease    Past Surgical History:  Procedure Laterality Date   ABDOMINAL HYSTERECTOMY     CHOLECYSTECTOMY     Patient Active Problem List   Diagnosis Date Noted   Elevated lactic acid level    SIRS (systemic inflammatory response syndrome) (HCC)    Abdominal pain 09/24/2014   Sepsis (HCC) 09/24/2014   Diabetes mellitus type 2, controlled (HCC) 09/24/2014   Essential hypertension 09/24/2014    PCP: Cheron Schaumann., MD   REFERRING PROVIDER: Charlestine Massed, PA-C  REFERRING DIAG: [phone] (ICD-10-CM) - Unilateral primary osteoarthritis, left hip  THERAPY DIAG:  Pain in left hip  Muscle weakness (generalized)  Other abnormalities of gait and mobility  Rationale for Evaluation and Treatment: Rehabilitation  ONSET DATE: 01/06/23  SUBJECTIVE:   SUBJECTIVE STATEMENT: L THA on 01/06/23 and reports little pain. Unable to sleep in bed due to elevated head but not restricted in bed mobility or transfers.  Able to negotiate 5-6 steps at home using rail.  PERTINENT HISTORY: Anna Byrd is a 82 y.o. female who presents s/p the above. She denies any fevers or chills over the last couple weeks. No issues with any pain medication. She states that the hip seems to be doing very well. She states she did she is  having to urinate a little more frequently but she has no other signs of urinary tract infection. She will continue to monitor this. I advised her that if she does notice any other UTI symptoms to either let us know or follow-up with her PCP. No interval injuries. No other concerns.  PAIN:  Are you having pain? Yes: NPRS scale: 2/10 Pain location: L hip Pain description: ache Aggravating factors: prolonged activity Relieving factors: rest  PRECAUTIONS: Posterior hip  RED FLAGS: None   WEIGHT BEARING RESTRICTIONS: No  FALLS:  Has patient fallen in last 6 months?  no  LIVING ENVIRONMENT: Lives with: lives with their family Lives in: House/apartment Stairs:  6  Has following equipment at home: Environmental consultant - 2 wheeled  OCCUPATION: retired  PLOF: Independent  PATIENT GOALS: To be able to put my L shoe on  NEXT MD VISIT: Oct 3  OBJECTIVE:   DIAGNOSTIC FINDINGS: none available  PATIENT SURVEYS:  FOTO 55(63 predicted)  MUSCLE LENGTH: Hamstrings: Right 80 deg; Left 70 deg   POSTURE:  flexed L hip posture  PALPATION: deferred LOWER EXTREMITY ROM:  Active ROM Right eval Left eval  Hip flexion 110d 80d  Hip extension 0d 15d  Hip abduction    Hip adduction    Hip internal rotation    Hip external rotation    Knee flexion    Knee extension  Ankle dorsiflexion    Ankle plantarflexion    Ankle inversion    Ankle eversion     (Blank rows = not tested)  LOWER EXTREMITY MMT:  MMT Right eval Left eval  Hip flexion 5 3  Hip extension 5 3  Hip abduction 5 3  Hip adduction    Hip internal rotation    Hip external rotation    Knee flexion    Knee extension    Ankle dorsiflexion    Ankle plantarflexion    Ankle inversion    Ankle eversion     (Blank rows = not tested)    FUNCTIONAL TESTS: 5x STS with UE support 19s    TUG 19s with RW   GAIT: Distance walked: 56ft x2 Assistive device utilized: Environmental consultant - 2 wheeled Level of assistance: Modified  independence Comments: slow cadence and decreased L step length   TODAY'S TREATMENT:                                                                                                                              DATE: 01/25/23 Eval and HEP    PATIENT EDUCATION:  Education details: Discussed eval findings, rehab rationale and POC and patient is in agreement  Person educated: Patient Education method: Explanation Education comprehension: verbalized understanding and needs further education  HOME EXERCISE PROGRAM: Access Code: [email or web address] URL: [email or web address] Date: 01/25/2023 Prepared by: Gustavus Bryant  Exercises - Modified Erby Pian  - 2 x daily - 5 x weekly - 1 sets - 2 reps - 30s hold - Supine March  - 2 x daily - 5 x weekly - 2 sets - 10 reps - Heel Toe Raises with Counter Support  - 2 x daily - 5 x weekly - 2 sets - 10 reps - Clamshell  - 2 x daily - 5 x weekly - 2 sets - 10 reps - Sit to Stand with Armchair  - 2 x daily - 5 x weekly - 1 sets - 5 reps  ASSESSMENT:  CLINICAL IMPRESSION: Patient is a 82 y.o. female who was seen today for physical therapy evaluation and treatment for L hip weakness, loss of motion and gait disorder following L THA posterior approach on 01/06/23. Patient presents with decreased L hip ROM and strength, increased time on STS and TUG tests but good mobility with transfers and bed mobility.  FOTO score shows limitations in function but pain is managed.   OBJECTIVE IMPAIRMENTS: Abnormal gait, decreased activity tolerance, decreased balance, decreased knowledge of use of DME, decreased mobility, difficulty walking, decreased ROM, decreased strength, postural dysfunction, and pain.   ACTIVITY LIMITATIONS: carrying, lifting, squatting, sleeping, and stairs  PERSONAL FACTORS: Age, Fitness, and 1 comorbidity: DM  are also affecting patient's functional outcome.   REHAB POTENTIAL: Good  CLINICAL DECISION MAKING:  Stable/uncomplicated  EVALUATION COMPLEXITY: Low   GOALS: Goals reviewed with patient? No  SHORT TERM GOALS: Target date: 02/15/2023   Patient to demonstrate  independence in HEP  Baseline: [id number] Goal status: INITIAL  2.  294ft ambulation with LRAD Baseline: 39ft with RW Goal status: INITIAL   LONG TERM GOALS: Target date: 03/08/2023  Increase FOTO score to 63 Baseline: 55 Goal status: INITIAL  2.  Increase AROM L hip to 90d flexion and 10d extension  Baseline:  Active ROM Right eval Left eval  Hip flexion 110d 80d  Hip extension 0d 15d   Goal status: INITIAL  3.  550ft ambulation with LRAD Baseline: 27ft with RW Goal status: INITIAL  4.  Able to negotiate 16 steps with most appropriate pattern Baseline: 5-6 steps with rail and step to pattern Goal status: INITIAL  5.  Increase L hip strength to 4/5 Baseline:  MMT Right eval Left eval  Hip flexion 5 3  Hip extension 5 3  Hip abduction 5 3   Goal status: INITIAL     PLAN:  PT FREQUENCY: 1-2x/week  PT DURATION: 6 weeks  PLANNED INTERVENTIONS: Therapeutic exercises, Therapeutic activity, Neuromuscular re-education, Balance training, Gait training, Patient/Family education, Self Care, Joint mobilization, Stair training, DME instructions, Aquatic Therapy, Dry Needling, Electrical stimulation, Cryotherapy, Moist heat, Manual therapy, and Re-evaluation  PLAN FOR NEXT SESSION: HEP review and update, manual techniques as appropriate, aerobic tasks, ROM and flexibility activities, strengthening and PREs, TPDN, gait and balance training as needed     Hildred Laser, PT 01/25/2023, 10:59 AM   Referring diagnosis? Unilateral primary osteoarthritis, left hip Treatment diagnosis? (if different than referring diagnosis) S/P L THA What was this (referring dx) caused by? [x]  Surgery []  Fall []  Ongoing issue [x]  Arthritis []  Other: ____________  Laterality: []  Rt [x]  Lt []  Both  Check all possible  CPT codes:  *CHOOSE 10 OR LESS*    []  97110 (Therapeutic Exercise)  []  16109 (SLP Treatment)  []  97112 (Neuro Re-ed)   []  92526 (Swallowing Treatment)   []  97116 (Gait Training)   []  [id number] (Cognitive Training, 1st 15 minutes) []  97140 (Manual Therapy)   []  97130 (Cognitive Training, each add'l 15 minutes)  []  97164 (Re-evaluation)                              []  Other, List CPT Code ____________  []  97530 (Therapeutic Activities)     []  97535 (Self Care)   [x]  All codes above (97110 - 97535)  []  97012 (Mechanical Traction)  []  97014 (E-stim Unattended)  []  97032 (E-stim manual)  []  97033 (Ionto)  []  97035 (Ultrasound) []  97750 (Physical Performance Training) []  [id number] (Aquatic Therapy) []  97016 (Vasopneumatic Device) []  [id number] (Paraffin) []  97034 (Contrast Bath) []  97597 (Wound Care 1st 20 sq cm) []  97598 (Wound Care each add'l 20 sq cm) []  97760 (Orthotic Fabrication, Fitting, Training Initial) []  [id number] (Prosthetic Management and Training Initial) []  [id number] (Orthotic or Prosthetic Training/ Modification Subsequent)

## 2023-01-25 ENCOUNTER — Other Ambulatory Visit: Payer: Self-pay

## 2023-01-25 ENCOUNTER — Ambulatory Visit: Payer: Medicare PPO | Attending: Orthopedic Surgery

## 2023-01-25 DIAGNOSIS — R2689 Other abnormalities of gait and mobility: Secondary | ICD-10-CM | POA: Diagnosis present

## 2023-01-25 DIAGNOSIS — M25552 Pain in left hip: Secondary | ICD-10-CM | POA: Insufficient documentation

## 2023-01-25 DIAGNOSIS — M6281 Muscle weakness (generalized): Secondary | ICD-10-CM | POA: Diagnosis present

## 2023-01-27 ENCOUNTER — Other Ambulatory Visit: Payer: Self-pay

## 2023-01-27 ENCOUNTER — Ambulatory Visit: Payer: Medicare PPO | Admitting: Physical Therapy

## 2023-01-27 ENCOUNTER — Encounter: Payer: Self-pay | Admitting: Physical Therapy

## 2023-01-27 DIAGNOSIS — M25552 Pain in left hip: Secondary | ICD-10-CM

## 2023-01-27 DIAGNOSIS — M6281 Muscle weakness (generalized): Secondary | ICD-10-CM

## 2023-01-27 DIAGNOSIS — R2689 Other abnormalities of gait and mobility: Secondary | ICD-10-CM

## 2023-01-27 NOTE — Patient Instructions (Signed)
Access Code: [email or web address] URL: [email or web address] Date: 01/27/2023 Prepared by: Rosana Hoes  Exercises - Modified Erby Pian  - 2 x daily - 5 x weekly - 1 sets - 2 reps - 30s hold - Supine March  - 2 x daily - 5 x weekly - 2 sets - 10 reps - Clamshell  - 2 x daily - 5 x weekly - 2 sets - 10 reps - Sit to Stand with Armchair  - 2 x daily - 5 x weekly - 2 sets - 10 reps - Heel Toe Raises with Counter Support  - 2 x daily - 5 x weekly - 2 sets - 10 reps - Standing Hip Abduction with Counter Support  - 2 x daily - 5 x weekly - 2 sets - 10 reps - Standing Hip Extension with Counter Support  - 2 x daily - 5 x weekly - 2 sets - 10 reps

## 2023-01-27 NOTE — Therapy (Signed)
OUTPATIENT PHYSICAL THERAPY TREATMENT   Patient Name: Anna Byrd MRN: [id number] DOB:01-Sep-1940, 82 y.o., female Today's Date: 01/27/2023   END OF SESSION:  PT End of Session - 01/27/23 0901     Visit Number 2    Number of Visits 12    Date for PT Re-Evaluation 03/27/23    Authorization Type [employer] MCR    PT Start Time 0845    PT Stop Time 0925    PT Time Calculation (min) 40 min    Activity Tolerance Patient tolerated treatment well    Behavior During Therapy WFL for tasks assessed/performed              Past Medical History:  Diagnosis Date   Diabetes mellitus without complication (HCC)    Gout    Hypertension    Thyroid disease    Past Surgical History:  Procedure Laterality Date   ABDOMINAL HYSTERECTOMY     CHOLECYSTECTOMY     Patient Active Problem List   Diagnosis Date Noted   Elevated lactic acid level    SIRS (systemic inflammatory response syndrome) (HCC)    Abdominal pain 09/24/2014   Sepsis (HCC) 09/24/2014   Diabetes mellitus type 2, controlled (HCC) 09/24/2014   Essential hypertension 09/24/2014    PCP: Cheron Schaumann., MD   REFERRING PROVIDER: Charlestine Massed, PA-C  REFERRING DIAG: N82.95 (ICD-10-CM) - Unilateral primary osteoarthritis, left hip  THERAPY DIAG:  Pain in left hip  Muscle weakness (generalized)  Other abnormalities of gait and mobility  Rationale for Evaluation and Treatment: Rehabilitation  ONSET DATE: 01/06/23   SUBJECTIVE:  SUBJECTIVE STATEMENT: Patient reports she has been doing well. She has been walking around better and has been transitioning herself to a cane at home.   PERTINENT HISTORY: See PMH above  PAIN:  Are you having pain? Yes:  NPRS scale: 1/10 Pain location: Left hip Pain description: ache Aggravating factors: prolonged activity Relieving factors: rest  PRECAUTIONS: None / she did have posterior THA so proceed as tolerated  PATIENT GOALS: To be able to put my L shoe  on   OBJECTIVE:  PATIENT SURVEYS:  FOTO 55 (63 predicted)  MUSCLE LENGTH: Hamstrings: Right 80 deg; Left 70 deg  POSTURE:  flexed L hip posture  LOWER EXTREMITY ROM:  Active ROM Right eval Left eval  Hip flexion 110d 80d  Hip extension 0d 15d  Hip abduction    Hip adduction    Hip internal rotation    Hip external rotation    Knee flexion    Knee extension    Ankle dorsiflexion    Ankle plantarflexion    Ankle inversion    Ankle eversion     (Blank rows = not tested)  LOWER EXTREMITY MMT:  MMT Right eval Left eval  Hip flexion 5 3  Hip extension 5 3  Hip abduction 5 3  Hip adduction    Hip internal rotation    Hip external rotation    Knee flexion    Knee extension    Ankle dorsiflexion    Ankle plantarflexion    Ankle inversion    Ankle eversion     (Blank rows = not tested)  FUNCTIONAL TESTS:  5x STS with UE support 19s  TUG 19s with RW  GAIT: Distance walked: 58ft x2 Assistive device utilized: Environmental consultant - 2 wheeled Level of assistance: Modified independence Comments: slow cadence and decreased L step length   TODAY'S TREATMENT:  [facility] Adult PT Treatment:                                                DATE: 01/27/2023 Therapeutic Exercise: NuStep L4 x 5 min with UE/LE while taking subjective Hooklying marching 3 x 20 Hooklying clamshell with red 3 x 10 Bridge partial range 3 x 10 LAQ with 4# 3 x 10 Sit to stand 3 x 10 Standing march 2 x 20 Standing heel raises 2 x 10 Standing hip abduction and extension with red at knees 2 x 10 each   PATIENT EDUCATION:  Education details: HEP update Person educated: Patient Education method: Explanation, Handout Education comprehension: verbalized understanding and needs further education  HOME EXERCISE PROGRAM: Access Code: [email or web address]   ASSESSMENT: CLINICAL IMPRESSION: Patient tolerated therapy well with no adverse effects. Therapy focused on progressing her hip strengthening with  good tolerance. She did require wedge bolster under trunk with supine exercises due to report of vertigo. She was able to progress well with her hip strengthening and tolerated increased resistance with standing exercises. Updated her HEP to progress strengthening for home, and instructed patient on transitioning to a cane as tolerated for household ambulation. Patient would benefit from continued skilled PT to progress her mobility and strength in order to maximize her functional ability.    OBJECTIVE IMPAIRMENTS: Abnormal gait, decreased activity tolerance, decreased balance, decreased knowledge of use of DME, decreased mobility, difficulty walking, decreased ROM, decreased strength, postural dysfunction, and pain.   ACTIVITY LIMITATIONS: carrying, lifting, squatting, sleeping, and stairs  PERSONAL FACTORS: Age, Fitness, and 1 comorbidity: DM  are also affecting patient's functional outcome.    GOALS: Goals reviewed with patient? No  SHORT TERM GOALS: Target date: 02/15/2023   Patient to demonstrate independence in HEP  Baseline: [id number] Goal status: INITIAL  2.  222ft ambulation with LRAD Baseline: 68ft with RW Goal status: INITIAL  LONG TERM GOALS: Target date: 03/08/2023  Increase FOTO score to 63 Baseline: 55 Goal status: INITIAL  2.  Increase AROM L hip to 90d flexion and 10d extension  Baseline:  Active ROM Right eval Left eval  Hip flexion 110d 80d  Hip extension 0d 15d   Goal status: INITIAL  3.  551ft ambulation with LRAD Baseline: 3ft with RW Goal status: INITIAL  4.  Able to negotiate 16 steps with most appropriate pattern Baseline: 5-6 steps with rail and step to pattern Goal status: INITIAL  5.  Increase L hip strength to 4/5 Baseline:  MMT Right eval Left eval  Hip flexion 5 3  Hip extension 5 3  Hip abduction 5 3   Goal status: INITIAL   PLAN: PT FREQUENCY: 1-2x/week  PT DURATION: 6 weeks  PLANNED INTERVENTIONS: Therapeutic exercises,  Therapeutic activity, Neuromuscular re-education, Balance training, Gait training, Patient/Family education, Self Care, Joint mobilization, Stair training, DME instructions, Aquatic Therapy, Dry Needling, Electrical stimulation, Cryotherapy, Moist heat, Manual therapy, and Re-evaluation  PLAN FOR NEXT SESSION: HEP review and update, manual techniques as appropriate, aerobic tasks, ROM and flexibility activities, strengthening and PREs, TPDN, gait and balance training as needed     Rosana Hoes, PT, DPT, LAT, ATC 01/27/23  9:33 AM Phone: [phone] Fax: [phone]

## 2023-02-01 ENCOUNTER — Ambulatory Visit: Payer: Medicare PPO | Admitting: Physical Therapy

## 2023-02-04 ENCOUNTER — Ambulatory Visit: Payer: Medicare PPO | Admitting: Physical Therapy

## 2023-02-04 ENCOUNTER — Encounter: Payer: Self-pay | Admitting: Physical Therapy

## 2023-02-04 DIAGNOSIS — R2689 Other abnormalities of gait and mobility: Secondary | ICD-10-CM

## 2023-02-04 DIAGNOSIS — M6281 Muscle weakness (generalized): Secondary | ICD-10-CM

## 2023-02-04 DIAGNOSIS — M25552 Pain in left hip: Secondary | ICD-10-CM

## 2023-02-04 NOTE — Therapy (Signed)
OUTPATIENT PHYSICAL THERAPY TREATMENT   Patient Name: Anna Byrd MRN: [id number] DOB:Dec 05, 1940, 82 y.o., female Today's Date: 02/04/2023   END OF SESSION:  PT End of Session - 02/04/23 1017     Visit Number 3    Number of Visits 12    Date for PT Re-Evaluation 03/27/23    Authorization Type [employer] MCR    Authorization Time Period 01/27/23-03/13/23    Authorization - Visit Number 2    Authorization - Number of Visits 12    PT Start Time 1015    PT Stop Time 1100    PT Time Calculation (min) 45 min              Past Medical History:  Diagnosis Date   Diabetes mellitus without complication (HCC)    Gout    Hypertension    Thyroid disease    Past Surgical History:  Procedure Laterality Date   ABDOMINAL HYSTERECTOMY     CHOLECYSTECTOMY     Patient Active Problem List   Diagnosis Date Noted   Elevated lactic acid level    SIRS (systemic inflammatory response syndrome) (HCC)    Abdominal pain 09/24/2014   Sepsis (HCC) 09/24/2014   Diabetes mellitus type 2, controlled (HCC) 09/24/2014   Essential hypertension 09/24/2014    PCP: Cheron Schaumann., MD   REFERRING PROVIDER: Charlestine Massed, PA-C  REFERRING DIAG: N82.95 (ICD-10-CM) - Unilateral primary osteoarthritis, left hip  THERAPY DIAG:  Pain in left hip  Muscle weakness (generalized)  Other abnormalities of gait and mobility  Rationale for Evaluation and Treatment: Rehabilitation  ONSET DATE: 01/06/23   SUBJECTIVE:  SUBJECTIVE STATEMENT: Patient reports she has been doing well. She is more achy and sore this morning due to the weather. Did well after last session, just a little tired.  PERTINENT HISTORY: See PMH above  PAIN:  Are you having pain? Yes:  NPRS scale: 3/10 Pain location: Left hip Pain description: ache Aggravating factors: prolonged activity Relieving factors: rest  PRECAUTIONS: None / she did have posterior THA so proceed as tolerated  PATIENT GOALS: To be  able to put my L shoe on   OBJECTIVE:  PATIENT SURVEYS:  FOTO 55 (63 predicted)  MUSCLE LENGTH: Hamstrings: Right 80 deg; Left 70 deg  POSTURE:  flexed L hip posture  LOWER EXTREMITY ROM:  Active ROM Right eval Left eval  Hip flexion 110d 80d  Hip extension 0d 15d  Hip abduction    Hip adduction    Hip internal rotation    Hip external rotation    Knee flexion    Knee extension    Ankle dorsiflexion    Ankle plantarflexion    Ankle inversion    Ankle eversion     (Blank rows = not tested)  LOWER EXTREMITY MMT:  MMT Right eval Left eval  Hip flexion 5 3  Hip extension 5 3  Hip abduction 5 3  Hip adduction    Hip internal rotation    Hip external rotation    Knee flexion    Knee extension    Ankle dorsiflexion    Ankle plantarflexion    Ankle inversion    Ankle eversion     (Blank rows = not tested)  FUNCTIONAL TESTS:  5x STS with UE support 19s  TUG 19s with RW  GAIT: Distance walked: 52ft x2 Assistive device utilized: Environmental consultant - 2 wheeled Level of assistance: Modified independence Comments: slow cadence and decreased L step length   TODAY'S TREATMENT:  [facility] Adult PT Treatment:                                                DATE: 02/04/23 Therapeutic Exercise: Nustep L4 x 5 minutes  Heel raise x 20 March 10 each  x 2  Standing hip abduction and extension with red at knees 2 x 10 each (rest break between sets)  10 x 3 STS UE to rise LAQ 4# 10 x 3  Seated  marching 1 x 20 Hook lying clamshell red 10 x 4  Bridge partial range 3 x 10    [facility] Adult PT Treatment:                                                DATE: 01/27/2023 Therapeutic Exercise: NuStep L4 x 5 min with UE/LE while taking subjective Hooklying marching 3 x 20 Hooklying clamshell with red 3 x 10 Bridge partial range 3 x 10 LAQ with 4# 3 x 10 Sit to stand 3 x 10 Standing march 2 x 20 Standing heel raises 2 x 10 Standing hip abduction and extension with red at  knees 2 x 10 each    PATIENT EDUCATION:  Education details: HEP update Person educated: Patient Education method: Explanation, Handout Education comprehension: verbalized understanding and needs further education  HOME EXERCISE PROGRAM: Access Code: [email or web address]   ASSESSMENT: CLINICAL IMPRESSION: Patient tolerated therapy well with no adverse effects. Therapy focused on continuation of her hip strengthening with good tolerance. She did require wedge bolster under trunk with supine exercises due to report of vertigo. She reports that she can walk around her home without AD.  Patient would benefit from continued skilled PT to progress her mobility and strength in order to maximize her functional ability.    OBJECTIVE IMPAIRMENTS: Abnormal gait, decreased activity tolerance, decreased balance, decreased knowledge of use of DME, decreased mobility, difficulty walking, decreased ROM, decreased strength, postural dysfunction, and pain.   ACTIVITY LIMITATIONS: carrying, lifting, squatting, sleeping, and stairs  PERSONAL FACTORS: Age, Fitness, and 1 comorbidity: DM  are also affecting patient's functional outcome.    GOALS: Goals reviewed with patient? No  SHORT TERM GOALS: Target date: 02/15/2023   Patient to demonstrate independence in HEP  Baseline: [id number] Goal status: INITIAL  2.  240ft ambulation with LRAD Baseline: 21ft with RW Goal status: INITIAL  LONG TERM GOALS: Target date: 03/08/2023  Increase FOTO score to 63 Baseline: 55 Goal status: INITIAL  2.  Increase AROM L hip to 90d flexion and 10d extension  Baseline:  Active ROM Right eval Left eval  Hip flexion 110d 80d  Hip extension 0d 15d   Goal status: INITIAL  3.  531ft ambulation with LRAD Baseline: 13ft with RW Goal status: INITIAL  4.  Able to negotiate 16 steps with most appropriate pattern Baseline: 5-6 steps with rail and step to pattern Goal status: INITIAL  5.  Increase L hip strength to  4/5 Baseline:  MMT Right eval Left eval  Hip flexion 5 3  Hip extension 5 3  Hip abduction 5 3   Goal status: INITIAL   PLAN: PT FREQUENCY: 1-2x/week  PT DURATION: 6 weeks  PLANNED INTERVENTIONS: Therapeutic exercises, Therapeutic activity, Neuromuscular re-education, Balance  training, Gait training, Patient/Family education, Self Care, Joint mobilization, Stair training, DME instructions, Aquatic Therapy, Dry Needling, Electrical stimulation, Cryotherapy, Moist heat, Manual therapy, and Re-evaluation  PLAN FOR NEXT SESSION: HEP review and update, manual techniques as appropriate, aerobic tasks, ROM and flexibility activities, strengthening and PREs, TPDN, gait and balance training as needed     Jannette Spanner, PTA 02/04/23 12:43 PM Phone: [phone] Fax: [phone]

## 2023-02-09 ENCOUNTER — Ambulatory Visit: Payer: Medicare PPO | Admitting: Physical Therapy

## 2023-02-11 ENCOUNTER — Other Ambulatory Visit: Payer: Self-pay

## 2023-02-11 ENCOUNTER — Encounter: Payer: Self-pay | Admitting: Physical Therapy

## 2023-02-11 ENCOUNTER — Ambulatory Visit: Payer: Medicare PPO | Admitting: Physical Therapy

## 2023-02-11 DIAGNOSIS — M6281 Muscle weakness (generalized): Secondary | ICD-10-CM

## 2023-02-11 DIAGNOSIS — R2689 Other abnormalities of gait and mobility: Secondary | ICD-10-CM

## 2023-02-11 DIAGNOSIS — M25552 Pain in left hip: Secondary | ICD-10-CM | POA: Diagnosis not present

## 2023-02-11 NOTE — Therapy (Signed)
OUTPATIENT PHYSICAL THERAPY TREATMENT   Patient Name: Anna Byrd MRN: [id number] DOB:24-Mar-1941, 82 y.o., female Today's Date: 02/11/2023   END OF SESSION:  PT End of Session - 02/11/23 1121     Visit Number 4    Number of Visits 12    Date for PT Re-Evaluation 03/27/23    Authorization Type [employer] M[employer]    Authorization Time Period 01/27/23-03/13/23    Authorization - Visit Number 3    Authorization - Number of Visits 12    PT Start Time 1100    PT Stop Time 1145    PT Time Calculation (min) 45 min    Activity Tolerance Patient tolerated treatment well    Behavior During Therapy WFL for tasks assessed/performed               Past Medical History:  Diagnosis Date   Diabetes mellitus without complication (HCC)    Gout    Hypertension    Thyroid disease    Past Surgical History:  Procedure Laterality Date   ABDOMINAL HYSTERECTOMY     CHOLECYSTECTOMY     Patient Active Problem List   Diagnosis Date Noted   Elevated lactic acid level    SIRS (systemic inflammatory response syndrome) (HCC)    Abdominal pain 09/24/2014   Sepsis (HCC) 09/24/2014   Diabetes mellitus type 2, controlled (HCC) 09/24/2014   Essential hypertension 09/24/2014    PCP: Cheron Schaumann., MD   REFERRING PROVIDER: Charlestine Massed, PA-C  REFERRING DIAG: N82.95 (ICD-10-CM) - Unilateral primary osteoarthritis, left hip  THERAPY DIAG:  Pain in left hip  Muscle weakness (generalized)  Other abnormalities of gait and mobility  Rationale for Evaluation and Treatment: Rehabilitation  ONSET DATE: 01/06/23   SUBJECTIVE:  SUBJECTIVE STATEMENT: Patient reports she had a rough day yesterday because she tried to use the can instead of the walker, so is back to using the walker today.   PERTINENT HISTORY: See PMH above  PAIN:  Are you having pain? 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 so proceed as tolerated  PATIENT GOALS: To be able to put my L shoe on   OBJECTIVE:  PATIENT SURVEYS:  FOTO 55 (63 predicted)  MUSCLE LENGTH: Hamstrings: Right 80 deg; Left 70 deg  POSTURE:  flexed L hip posture  LOWER EXTREMITY ROM:  Active ROM Right eval Left eval  Hip flexion 110d 80d  Hip extension 0d 15d  Hip abduction    Hip adduction    Hip internal rotation    Hip external rotation    Knee flexion    Knee extension    Ankle dorsiflexion    Ankle plantarflexion    Ankle inversion    Ankle eversion     (Blank rows = not tested)  LOWER EXTREMITY MMT:  MMT Right eval Left eval  Hip flexion 5 3  Hip extension 5 3  Hip abduction 5 3  Hip adduction    Hip internal rotation    Hip external rotation    Knee flexion    Knee extension    Ankle dorsiflexion    Ankle plantarflexion    Ankle inversion    Ankle eversion     (Blank rows = not tested)  FUNCTIONAL TESTS:  5x STS with UE support 19s  TUG 19s with RW  GAIT: Distance walked: 30ft x2 Assistive device utilized: Environmental consultant -  2 wheeled Level of assistance: Modified independence Comments: slow cadence and decreased L step length   TODAY'S TREATMENT:                      [facility] Adult PT Treatment:                                                DATE: 02/11/2023 Therapeutic Exercise: NuStep L4 x 5 min with UE/LE while taking subjective Hooklying marching x 20, with red 2 x 20 Hooklying unilateral clamshell with red 3 x 10 each Bridge partial range 3 x 10 SLR partial range 3 x 5 on left LAQ with 5# 3 x 10 each Sit to stand 3 x 10 Standing march 2 x 20 Standing hip abduction 2 x 15 each    [facility] Adult PT Treatment:                                                DATE: 02/04/23 Therapeutic Exercise: Nustep L4 x 5 minutes  Heel raise x 20 March 10 each  x 2  Standing hip abduction and extension with red at knees 2 x 10 each (rest break  between sets)  10 x 3 STS UE to rise LAQ 4# 10 x 3  Seated  marching 1 x 20 Hook lying clamshell red 10 x 4  Bridge partial range 3 x 10  [facility] Adult PT Treatment:                                                DATE: 01/27/2023 Therapeutic Exercise: NuStep L4 x 5 min with UE/LE while taking subjective Hooklying marching 3 x 20 Hooklying clamshell with red 3 x 10 Bridge partial range 3 x 10 LAQ with 4# 3 x 10 Sit to stand 3 x 10 Standing march 2 x 20 Standing heel raises 2 x 10 Standing hip abduction and extension with red at knees 2 x 10 each   PATIENT EDUCATION:  Education details: HEP update Person educated: Patient Education method: Explanation, Handout Education comprehension: verbalized understanding and needs further education  HOME EXERCISE PROGRAM: Access Code: [email or web address]   ASSESSMENT: CLINICAL IMPRESSION: Patient tolerated therapy well with no adverse effects. She continues to require wedge bolster under trunk with supine exercises due to report of vertigo. Therapy continues to focus on progressing hip strengthening with good tolerance. She was able to progress with hip strengthening this visit but does report fatigue and soreness of the left hip following therapy. She was able to complete partial range SLR this visit. No changes to HEP this visit. Patient would benefit from continued skilled PT to progress her mobility and strength in order to maximize her functional ability.    OBJECTIVE IMPAIRMENTS: Abnormal gait, decreased activity tolerance, decreased balance, decreased knowledge of use of DME, decreased mobility, difficulty walking, decreased ROM, decreased strength, postural dysfunction, and pain.   ACTIVITY LIMITATIONS: carrying, lifting, squatting, sleeping, and stairs  PERSONAL FACTORS: Age, Fitness, and 1 comorbidity: DM  are also affecting patient's functional outcome.    GOALS: Goals reviewed  with patient? No  SHORT TERM GOALS: Target date: 02/15/2023    Patient to demonstrate independence in HEP  Baseline: [id number] Goal status: INITIAL  2.  271ft ambulation with LRAD Baseline: 21ft with RW Goal status: INITIAL  LONG TERM GOALS: Target date: 03/08/2023  Increase FOTO score to 63 Baseline: 55 Goal status: INITIAL  2.  Increase AROM L hip to 90d flexion and 10d extension  Baseline:  Active ROM Right eval Left eval  Hip flexion 110d 80d  Hip extension 0d 15d   Goal status: INITIAL  3.  567ft ambulation with LRAD Baseline: 18ft with RW Goal status: INITIAL  4.  Able to negotiate 16 steps with most appropriate pattern Baseline: 5-6 steps with rail and step to pattern Goal status: INITIAL  5.  Increase L hip strength to 4/5 Baseline:  MMT Right eval Left eval  Hip flexion 5 3  Hip extension 5 3  Hip abduction 5 3   Goal status: INITIAL   PLAN: PT FREQUENCY: 1-2x/week  PT DURATION: 6 weeks  PLANNED INTERVENTIONS: Therapeutic exercises, Therapeutic activity, Neuromuscular re-education, Balance training, Gait training, Patient/Family education, Self Care, Joint mobilization, Stair training, DME instructions, Aquatic Therapy, Dry Needling, Electrical stimulation, Cryotherapy, Moist heat, Manual therapy, and Re-evaluation  PLAN FOR NEXT SESSION: HEP review and update, manual techniques as appropriate, aerobic tasks, ROM and flexibility activities, strengthening and PREs, TPDN, gait and balance training as needed     Rosana Hoes, PT, DPT, LAT, ATC 02/11/23  12:30 PM Phone: [phone] Fax: [phone]

## 2023-03-03 ENCOUNTER — Encounter: Payer: Self-pay | Admitting: Physical Therapy

## 2023-03-03 ENCOUNTER — Ambulatory Visit: Payer: Medicare PPO | Attending: Orthopedic Surgery | Admitting: Physical Therapy

## 2023-03-03 DIAGNOSIS — M25552 Pain in left hip: Secondary | ICD-10-CM | POA: Insufficient documentation

## 2023-03-03 DIAGNOSIS — R2689 Other abnormalities of gait and mobility: Secondary | ICD-10-CM | POA: Diagnosis present

## 2023-03-03 DIAGNOSIS — M6281 Muscle weakness (generalized): Secondary | ICD-10-CM | POA: Insufficient documentation

## 2023-03-03 NOTE — Therapy (Signed)
OUTPATIENT PHYSICAL THERAPY TREATMENT   Patient Name: Anna Byrd MRN: [id number] DOB:[birth year], 82 y.o., female Today's Date: 03/03/2023   END OF SESSION:  PT End of Session - 03/03/23 1000     Visit Number 5    Number of Visits 12    Date for PT Re-Evaluation 03/27/23    Authorization Type [employer] M[employer]    Authorization Time Period 01/27/23-03/13/23    Authorization - Visit Number 4    Authorization - Number of Visits 12    PT Start Time 0930    PT Stop Time 1012    PT Time Calculation (min) 42 min                Past Medical History:  Diagnosis Date   Diabetes mellitus without complication (HCC)    Gout    Hypertension    Thyroid disease    Past Surgical History:  Procedure Laterality Date   ABDOMINAL HYSTERECTOMY     CHOLECYSTECTOMY     Patient Active Problem List   Diagnosis Date Noted   Elevated lactic acid level    SIRS (systemic inflammatory response syndrome) (HCC)    Abdominal pain 09/24/2014   Sepsis (HCC) 09/24/2014   Diabetes mellitus type 2, controlled (HCC) 09/24/2014   Essential hypertension 09/24/2014    PCP: Cheron Schaumann., MD   REFERRING PROVIDER: Charlestine Massed, PA-C  REFERRING DIAG: Z56.38 (ICD-10-CM) - Unilateral primary osteoarthritis, left hip  THERAPY DIAG:  Pain in left hip  Muscle weakness (generalized)  Rationale for Evaluation and Treatment: Rehabilitation  ONSET DATE: 01/06/23   SUBJECTIVE:  SUBJECTIVE STATEMENT: Just a little sore today, maybe because of the weather or my 37 year old grandson. Pt arrives with Rolator.    PERTINENT HISTORY: See PMH above  PAIN:  Are you having pain? Yes:  NPRS scale: 2/10 Pain location: left hip Pain description: ache Aggravating factors: prolonged activity Relieving factors: rest  PRECAUTIONS: None / she did have posterior THA so proceed as tolerated  PATIENT GOALS: To be able to put my L shoe on   OBJECTIVE:  PATIENT SURVEYS:  FOTO 55 (63  predicted)  MUSCLE LENGTH: Hamstrings: Right 80 deg; Left 70 deg  POSTURE:  flexed L hip posture  LOWER EXTREMITY ROM:  Active ROM Right eval Left eval  Hip flexion 110d 80d  Hip extension 0d 15d  Hip abduction    Hip adduction    Hip internal rotation    Hip external rotation    Knee flexion    Knee extension    Ankle dorsiflexion    Ankle plantarflexion    Ankle inversion    Ankle eversion     (Blank rows = not tested)  LOWER EXTREMITY MMT:  MMT Right eval Left eval  Hip flexion 5 3  Hip extension 5 3  Hip abduction 5 3  Hip adduction    Hip internal rotation    Hip external rotation    Knee flexion    Knee extension    Ankle dorsiflexion    Ankle plantarflexion    Ankle inversion    Ankle eversion     (Blank rows = not tested)  FUNCTIONAL TESTS:  5x STS with UE support 19s: 03/03/23: 14.7 sec , hands on thighs  TUG 19s with RW: 03/03/23: TUG 17.2 sec with RW  03/03/23: 264ft  GAIT: Distance walked: 80ft x2 Assistive device utilized: Environmental consultant - 2 wheeled Level of assistance: Modified independence Comments: slow cadence and decreased L  step length   TODAY'S TREATMENT:                      [facility] Adult PT Treatment:                                                DATE: 03/03/23 Therapeutic Exercise: Nustep L4 x 5  minutes UE/LE STS x 5 x 10 with UE on thighs Left SLR x 6  HL clam green band  Mini bridge   Neuromuscular re-ed: Narrow , 1/2 tandem, semi tandem stance with head turns. More challenged in semi tandem  Therapeutic Activity: Gait 390 feet with rolator (297 feet in 2 minutes) -c/o bilat UE pain/ strain 5 x STS/ 2 MWT   [facility] Adult PT Treatment:                                                DATE: 02/11/2023 Therapeutic Exercise: NuStep L4 x 5 min with UE/LE while taking subjective Hooklying marching x 20, with red 2 x 20 Hooklying unilateral clamshell with red 3 x 10 each Bridge partial range 3 x 10 SLR partial range 3 x 5 on  left LAQ with 5# 3 x 10 each Sit to stand 3 x 10 Standing march 2 x 20 Standing hip abduction 2 x 15 each    [facility] Adult PT Treatment:                                                DATE: 02/04/23 Therapeutic Exercise: Nustep L4 x 5 minutes  Heel raise x 20 March 10 each  x 2  Standing hip abduction and extension with red at knees 2 x 10 each (rest break between sets)  10 x 3 STS UE to rise LAQ 4# 10 x 3  Seated  marching 1 x 20 Hook lying clamshell red 10 x 4  Bridge partial range 3 x 10  [facility] Adult PT Treatment:                                                DATE: 01/27/2023 Therapeutic Exercise: NuStep L4 x 5 min with UE/LE while taking subjective Hooklying marching 3 x 20 Hooklying clamshell with red 3 x 10 Bridge partial range 3 x 10 LAQ with 4# 3 x 10 Sit to stand 3 x 10 Standing march 2 x 20 Standing heel raises 2 x 10 Standing hip abduction and extension with red at knees 2 x 10 each   PATIENT EDUCATION:  Education details: HEP update Person educated: Patient Education method: Explanation, Handout Education comprehension: verbalized understanding and needs further education  HOME EXERCISE PROGRAM: Access Code: [email or web address]   ASSESSMENT: CLINICAL IMPRESSION: Pt arrives after 3 week absence due to unavailable appointment times. She demonstrates increased hip flexion strength and hip flexion ROM, meeting LTG# 2. Her 5 x STS and TUG have improved. She arrives with rolator and reports she uses [facility]  in home. Her gait is antalgic when she steps away from her Rolator in clinic. Patient tolerated therapy well with no adverse effects. She continues to require wedge bolster under trunk with supine exercises due to report of vertigo. Therapy continues to focus on progressing hip strengthening with good tolerance. She was able to progress with balance in semi tandem stance. She was able to complete full range SLR this visit.   No changes to HEP this visit. Patient would benefit from  continued skilled PT to progress her mobility and strength in order to maximize her functional ability.    OBJECTIVE IMPAIRMENTS: Abnormal gait, decreased activity tolerance, decreased balance, decreased knowledge of use of DME, decreased mobility, difficulty walking, decreased ROM, decreased strength, postural dysfunction, and pain.   ACTIVITY LIMITATIONS: carrying, lifting, squatting, sleeping, and stairs  PERSONAL FACTORS: Age, Fitness, and 1 comorbidity: DM  are also affecting patient's functional outcome.    GOALS: Goals reviewed with patient? No  SHORT TERM GOALS: Target date: 02/15/2023   Patient to demonstrate independence in HEP  Baseline: [id number] Goal status: MET  2.  262ft ambulation with LRAD Baseline: 72ft with RW 03/03/23: 390 feet with rolator Goal status: ONGOING  LONG TERM GOALS: Target date: 03/08/2023  Increase FOTO score to 63 Baseline: 55 Goal status: INITIAL  2.  Increase AROM L hip to 90d flexion and 10d extension  Baseline:  Active ROM Right eval Left eval Left 03/03/23  Hip flexion 110d 80d 105  Hip extension 0d 15d    Goal status: MET  3.  552ft ambulation with LRAD Baseline: 87ft with RW 03/03/23: 390 with rolator Goal status: ONGOING  4.  Able to negotiate 16 steps with most appropriate pattern Baseline: 5-6 steps with rail and step to pattern Goal status: INITIAL  5.  Increase L hip strength to 4/5 Baseline:  MMT Right eval Left eval Left 03/03/23  Hip flexion 5 3 4-  Hip extension 5 3   Hip abduction 5 3    Goal status: ONGOING    PLAN: PT FREQUENCY: 1-2x/week  PT DURATION: 6 weeks  PLANNED INTERVENTIONS: Therapeutic exercises, Therapeutic activity, Neuromuscular re-education, Balance training, Gait training, Patient/Family education, Self Care, Joint mobilization, Stair training, DME instructions, Aquatic Therapy, Dry Needling, Electrical stimulation, Cryotherapy, Moist heat, Manual therapy, and Re-evaluation  PLAN  FOR NEXT SESSION: HEP review and update, manual techniques as appropriate, aerobic tasks, ROM and flexibility activities, strengthening and PREs, TPDN, gait and balance training as needed     Jannette Spanner, PTA 03/03/23 10:16 AM Phone: [phone] Fax: [phone]

## 2023-03-10 ENCOUNTER — Encounter: Payer: Self-pay | Admitting: Physical Therapy

## 2023-03-10 ENCOUNTER — Ambulatory Visit: Payer: Medicare PPO | Admitting: Physical Therapy

## 2023-03-10 DIAGNOSIS — R2689 Other abnormalities of gait and mobility: Secondary | ICD-10-CM

## 2023-03-10 DIAGNOSIS — M6281 Muscle weakness (generalized): Secondary | ICD-10-CM

## 2023-03-10 DIAGNOSIS — M25552 Pain in left hip: Secondary | ICD-10-CM | POA: Diagnosis not present

## 2023-03-10 NOTE — Therapy (Signed)
OUTPATIENT PHYSICAL THERAPY TREATMENT   Patient Name: Anna Byrd MRN: [id number] DOB:July 08, 1940, 82 y.o., female Today's Date: 03/10/2023   END OF SESSION:  PT End of Session - 03/10/23 0933     Visit Number 6    Number of Visits 12    Date for PT Re-Evaluation 03/27/23    Authorization Type [employer] MCR    Authorization Time Period 01/27/23-03/13/23    Authorization - Visit Number 5    Authorization - Number of Visits 12    PT Start Time 0930    PT Stop Time 1012    PT Time Calculation (min) 42 min                Past Medical History:  Diagnosis Date   Diabetes mellitus without complication (HCC)    Gout    Hypertension    Thyroid disease    Past Surgical History:  Procedure Laterality Date   ABDOMINAL HYSTERECTOMY     CHOLECYSTECTOMY     Patient Active Problem List   Diagnosis Date Noted   Elevated lactic acid level    SIRS (systemic inflammatory response syndrome) (HCC)    Abdominal pain 09/24/2014   Sepsis (HCC) 09/24/2014   Diabetes mellitus type 2, controlled (HCC) 09/24/2014   Essential hypertension 09/24/2014    PCP: Cheron Schaumann., MD   REFERRING PROVIDER: Charlestine Massed, PA-C  REFERRING DIAG: H08.65 (ICD-10-CM) - Unilateral primary osteoarthritis, left hip  THERAPY DIAG:  Pain in left hip  Muscle weakness (generalized)  Other abnormalities of gait and mobility  Rationale for Evaluation and Treatment: Rehabilitation  ONSET DATE: 01/06/23   SUBJECTIVE:  SUBJECTIVE STATEMENT: I'm walking more upright. I have been working on my exercises, no pain.    PERTINENT HISTORY: See PMH above  PAIN:  Are you having pain? Yes:  NPRS scale: 0/10 Pain location: left hip Pain description: ache Aggravating factors: prolonged activity Relieving factors: rest  PRECAUTIONS: None / she did have posterior THA so proceed as tolerated  PATIENT GOALS: To be able to put my L shoe on   OBJECTIVE:  PATIENT SURVEYS:  FOTO 55 (63  predicted)  MUSCLE LENGTH: Hamstrings: Right 80 deg; Left 70 deg  POSTURE:  flexed L hip posture  LOWER EXTREMITY ROM:  Active ROM Right eval Left eval Left 03/03/23  Hip flexion 110d 80d 105 d  Hip extension 0d 15d   Hip abduction     Hip adduction     Hip internal rotation     Hip external rotation     Knee flexion     Knee extension     Ankle dorsiflexion     Ankle plantarflexion     Ankle inversion     Ankle eversion      (Blank rows = not tested)  LOWER EXTREMITY MMT:  MMT Right eval Left eval Left  03/03/23  Hip flexion 5 3 4-  Hip extension 5 3   Hip abduction 5 3   Hip adduction     Hip internal rotation     Hip external rotation     Knee flexion     Knee extension     Ankle dorsiflexion     Ankle plantarflexion     Ankle inversion     Ankle eversion      (Blank rows = not tested)  FUNCTIONAL TESTS:  5x STS with UE support 19s: 03/03/23: 14.7 sec , hands on thighs  TUG 19s with RW: 03/03/23: TUG 17.2 sec  with RW  03/03/23: 262ft  GAIT: Distance walked: 88ft x2 Assistive device utilized: Environmental consultant - 2 wheeled Level of assistance: Modified independence Comments: slow cadence and decreased L step length   TODAY'S TREATMENT:                      [facility] Adult PT Treatment:                                                DATE: 03/10/23 Therapeutic Exercise: Nustep 5 min  Standing hip abduction  x 10 each  Standing hip flexion x 10 each  STS x 10 with hands on thighs - cues for EOM 4 inch step ups with bilateral UE on counter x 10 LLE.  HL with wedge:  Ball squeeze x 10, GTB Clam x 20 HL march GTB 10 x 2  SLR x 10 LLE Mini Bridge /PPT x 15  Neuromuscular re-ed: Tandem stance 20 sec each LLE back  Therapeutic Activity: 2 MWT: with SPC: 210 Feet     [facility] Adult PT Treatment:                                                DATE: 03/03/23 Therapeutic Exercise: Nustep L4 x 5  minutes UE/LE STS x 5 x 10 with UE on thighs Left SLR x 6  HL  clam green band  Mini bridge   Neuromuscular re-ed: Narrow , 1/2 tandem, semi tandem stance with head turns. More challenged in semi tandem  Therapeutic Activity: Gait 390 feet with rolator (297 feet in 2 minutes) -c/o bilat UE pain/ strain 5 x STS/ 2 MWT   [facility] Adult PT Treatment:                                                DATE: 02/11/2023 Therapeutic Exercise: NuStep L4 x 5 min with UE/LE while taking subjective Hooklying marching x 20, with red 2 x 20 Hooklying unilateral clamshell with red 3 x 10 each Bridge partial range 3 x 10 SLR partial range 3 x 5 on left LAQ with 5# 3 x 10 each Sit to stand 3 x 10 Standing march 2 x 20 Standing hip abduction 2 x 15 each    [facility] Adult PT Treatment:                                                DATE: 02/04/23 Therapeutic Exercise: Nustep L4 x 5 minutes  Heel raise x 20 March 10 each  x 2  Standing hip abduction and extension with red at knees 2 x 10 each (rest break between sets)  10 x 3 STS UE to rise LAQ 4# 10 x 3  Seated  marching 1 x 20 Hook lying clamshell red 10 x 4  Bridge partial range 3 x 10    PATIENT EDUCATION:  Education details: HEP update Person educated: Patient Education method: Programmer, multimedia, Herbalist  comprehension: verbalized understanding and needs further education  HOME EXERCISE PROGRAM: Access Code: [email or web address]   ASSESSMENT: CLINICAL IMPRESSION: 03/10/23: Pt arrives reporting improvement in her posture with gait. Able to ambulate 210 feet with clinic SPC and good safety, although back pain increases by 50%. STG# 2 met.  Tandem stance improved to >20 sec bilateral. She is ambulating stairs with step through pattern. Began 4 inch step up on surgical LE with good tolerance.   03/03/23: Pt arrives after 3 week absence due to unavailable appointment times. She demonstrates increased hip flexion strength and hip flexion ROM, meeting LTG# 2. Her 5 x STS and TUG have improved. She arrives with rolator  and reports she uses SPC in home. Her gait is antalgic when she steps away from her Rolator in clinic. Patient tolerated therapy well with no adverse effects. She continues to require wedge bolster under trunk with supine exercises due to report of vertigo. Therapy continues to focus on progressing hip strengthening with good tolerance. She was able to progress with balance in semi tandem stance. She was able to complete full range SLR this visit.   No changes to HEP this visit. Patient would benefit from continued skilled PT to progress her mobility and strength in order to maximize her functional ability.    OBJECTIVE IMPAIRMENTS: Abnormal gait, decreased activity tolerance, decreased balance, decreased knowledge of use of DME, decreased mobility, difficulty walking, decreased ROM, decreased strength, postural dysfunction, and pain.   ACTIVITY LIMITATIONS: carrying, lifting, squatting, sleeping, and stairs  PERSONAL FACTORS: Age, Fitness, and 1 comorbidity: DM  are also affecting patient's functional outcome.    GOALS: Goals reviewed with patient? No  SHORT TERM GOALS: Target date: 02/15/2023   Patient to demonstrate independence in HEP  Baseline: [id number] Goal status: MET  2.  222ft ambulation with LRAD Baseline: 20ft with RW 03/03/23: 390 feet with rolator 03/10/23: 210 feet SPC Goal status: MET  LONG TERM GOALS: Target date: 03/08/2023  Increase FOTO score to 63 Baseline: 55 Goal status: MET   2.  Increase AROM L hip to 90d flexion and 10d extension  Baseline:  Active ROM Right eval Left eval Left 03/03/23  Hip flexion 110d 80d 105  Hip extension 0d 15d    Goal status: MET  3.  574ft ambulation with LRAD Baseline: 75ft with RW 03/03/23: 390 with rolator Goal status: ONGOING  4.  Able to negotiate 16 steps with most appropriate pattern Baseline: 5-6 steps with rail and step to pattern 03/10/23: started 4 inch left step ups , tolerated well  Goal status:  ONGOING  5.  Increase L hip strength to 4/5 Baseline:  MMT Right eval Left eval Left 03/03/23  Hip flexion 5 3 4-  Hip extension 5 3   Hip abduction 5 3    Goal status: ONGOING    PLAN: PT FREQUENCY: 1-2x/week  PT DURATION: 6 weeks  PLANNED INTERVENTIONS: Therapeutic exercises, Therapeutic activity, Neuromuscular re-education, Balance training, Gait training, Patient/Family education, Self Care, Joint mobilization, Stair training, DME instructions, Aquatic Therapy, Dry Needling, Electrical stimulation, Cryotherapy, Moist heat, Manual therapy, and Re-evaluation  PLAN FOR NEXT SESSION: HEP review and update, manual techniques as appropriate, aerobic tasks, ROM and flexibility activities, strengthening and PREs, TPDN, gait and balance training as needed  , step ups    Jannette Spanner, PTA 03/10/23 10:19 AM Phone: [phone] Fax: [phone]

## 2023-03-16 ENCOUNTER — Ambulatory Visit: Payer: Medicare PPO | Admitting: Physical Therapy

## 2023-03-22 ENCOUNTER — Ambulatory Visit: Payer: Medicare PPO | Admitting: Physical Therapy

## 2023-03-24 NOTE — Therapy (Unsigned)
OUTPATIENT PHYSICAL THERAPY TREATMENT/DC SUMMARY   Patient Name: Anna Byrd MRN: [id number] DOB:10/19/1940, 82 y.o., female Today's Date: 03/25/2023 PHYSICAL THERAPY DISCHARGE SUMMARY  Visits from Start of Care: 7  Current functional level related to goals / functional outcomes: Goals met   Remaining deficits: None   Education / Equipment: HEP   Patient agrees to discharge. Patient goals were met. Patient is being discharged due to being pleased with the current functional level.   END OF SESSION:  PT End of Session - 03/25/23 1451     Visit Number 7 (P)     Number of Visits 12 (P)     Date for PT Re-Evaluation 03/27/23 (P)     Authorization Type [employer] M[employer] (P)     PT Start Time 1445 (P)     PT Stop Time 1530 (P)     PT Time Calculation (min) 45 min (P)     Activity Tolerance Patient tolerated treatment well (P)     Behavior During Therapy WFL for tasks assessed/performed (P)                  Past Medical History:  Diagnosis Date   Diabetes mellitus without complication (HCC)    Gout    Hypertension    Thyroid disease    Past Surgical History:  Procedure Laterality Date   ABDOMINAL HYSTERECTOMY     CHOLECYSTECTOMY     Patient Active Problem List   Diagnosis Date Noted   Elevated lactic acid level    SIRS (systemic inflammatory response syndrome) (HCC)    Abdominal pain 09/24/2014   Sepsis (HCC) 09/24/2014   Diabetes mellitus type 2, controlled (HCC) 09/24/2014   Essential hypertension 09/24/2014    PCP: Cheron Schaumann., MD   REFERRING PROVIDER: Charlestine Massed, PA-C  REFERRING DIAG: [id number] (ICD-10-CM) - Unilateral primary osteoarthritis, left hip  THERAPY DIAG:  Pain in left hip  Other abnormalities of gait and mobility  Muscle weakness (generalized)  Rationale for Evaluation and Treatment: Rehabilitation  ONSET DATE: 01/06/23   SUBJECTIVE:  SUBJECTIVE STATEMENT: I'm walking more upright. I have been working on my  exercises, no pain.    PERTINENT HISTORY: See PMH above  PAIN:  Are you having pain? Yes:  NPRS scale: 0/10 Pain location: left hip Pain description: ache Aggravating factors: prolonged activity Relieving factors: rest  PRECAUTIONS: None / she did have posterior THA so proceed as tolerated  PATIENT GOALS: To be able to put my L shoe on   OBJECTIVE:  PATIENT SURVEYS:  FOTO 55 (63 predicted); 03/25/23 78%  MUSCLE LENGTH: Hamstrings: Right 80 deg; Left 70 deg  POSTURE:  flexed L hip posture  LOWER EXTREMITY ROM:  Active ROM Right eval Left eval Left 03/03/23 L A/PROM 03/25/23  Hip flexion 110d 80d 105 d 95/105d  Hip extension 0d 15d  15d  Hip abduction      Hip adduction      Hip internal rotation      Hip external rotation      Knee flexion      Knee extension      Ankle dorsiflexion      Ankle plantarflexion      Ankle inversion      Ankle eversion       (Blank rows = not tested)  LOWER EXTREMITY MMT:  MMT Right eval Left eval Left  03/03/23 L 03/25/23  Hip flexion 5 3 4- 4  Hip extension 5 3  4   Hip  abduction 5 3  4   Hip adduction      Hip internal rotation      Hip external rotation      Knee flexion      Knee extension      Ankle dorsiflexion      Ankle plantarflexion      Ankle inversion      Ankle eversion       (Blank rows = not tested)  FUNCTIONAL TESTS:  5x STS with UE support 19s: 03/03/23: 14.7 sec , hands on thighs  TUG 19s with RW: 03/03/23: TUG 17.2 sec with RW  03/03/23: 297ft  GAIT: Distance walked: 77ft x2; 03/25/23 500+ ft w/o AD 16 steps with B rails and step through pattern Assistive device utilized: Environmental consultant - 2 wheeled Level of assistance: Modified independence Comments: slow cadence and decreased L step length   TODAY'S TREATMENT:                      [facility] Adult PT Treatment:                                                DATE: 03/10/23 Therapeutic Exercise: Nustep 5 min  Standing hip abduction  x 10 each   Standing hip flexion x 10 each  STS x 10 with hands on thighs - cues for EOM 4 inch step ups with bilateral UE on counter x 10 LLE.  HL with wedge:  Ball squeeze x 10, GTB Clam x 20 HL march GTB 10 x 2  SLR x 10 LLE Mini Bridge /PPT x 15  Neuromuscular re-ed: Tandem stance 20 sec each LLE back  Therapeutic Activity: 2 MWT: with SPC: 210 Feet     [facility] Adult PT Treatment:                                                DATE: 03/03/23 Therapeutic Exercise: Nustep L4 x 5  minutes UE/LE STS x 5 x 10 with UE on thighs Left SLR x 6  HL clam green band  Mini bridge   Neuromuscular re-ed: Narrow , 1/2 tandem, semi tandem stance with head turns. More challenged in semi tandem  Therapeutic Activity: Gait 390 feet with rolator (297 feet in 2 minutes) -c/o bilat UE pain/ strain 5 x STS/ 2 MWT   [facility] Adult PT Treatment:                                                DATE: 02/11/2023 Therapeutic Exercise: NuStep L4 x 5 min with UE/LE while taking subjective Hooklying marching x 20, with red 2 x 20 Hooklying unilateral clamshell with red 3 x 10 each Bridge partial range 3 x 10 SLR partial range 3 x 5 on left LAQ with 5# 3 x 10 each Sit to stand 3 x 10 Standing march 2 x 20 Standing hip abduction 2 x 15 each    [facility] Adult PT Treatment:  DATE: 02/04/23 Therapeutic Exercise: Nustep L4 x 5 minutes  Heel raise x 20 March 10 each  x 2  Standing hip abduction and extension with red at knees 2 x 10 each (rest break between sets)  10 x 3 STS UE to rise LAQ 4# 10 x 3  Seated  marching 1 x 20 Hook lying clamshell red 10 x 4  Bridge partial range 3 x 10    PATIENT EDUCATION:  Education details: HEP update Person educated: Patient Education method: Explanation, Handout Education comprehension: verbalized understanding and needs further education  HOME EXERCISE PROGRAM: Access Code: [email or web address]   ASSESSMENT: CLINICAL IMPRESSION: Rehab  goals met, patient ready for independent management   03/03/23: Pt arrives after 3 week absence due to unavailable appointment times. She demonstrates increased hip flexion strength and hip flexion ROM, meeting LTG# 2. Her 5 x STS and TUG have improved. She arrives with rolator and reports she uses SPC in home. Her gait is antalgic when she steps away from her Rolator in clinic. Patient tolerated therapy well with no adverse effects. She continues to require wedge bolster under trunk with supine exercises due to report of vertigo. Therapy continues to focus on progressing hip strengthening with good tolerance. She was able to progress with balance in semi tandem stance. She was able to complete full range SLR this visit.   No changes to HEP this visit. Patient would benefit from continued skilled PT to progress her mobility and strength in order to maximize her functional ability.    OBJECTIVE IMPAIRMENTS: Abnormal gait, decreased activity tolerance, decreased balance, decreased knowledge of use of DME, decreased mobility, difficulty walking, decreased ROM, decreased strength, postural dysfunction, and pain.   ACTIVITY LIMITATIONS: carrying, lifting, squatting, sleeping, and stairs  PERSONAL FACTORS: Age, Fitness, and 1 comorbidity: DM  are also affecting patient's functional outcome.    GOALS: Goals reviewed with patient? No  SHORT TERM GOALS: Target date: 02/15/2023   Patient to demonstrate independence in HEP  Baseline: [id number] Goal status: MET  2.  268ft ambulation with LRAD Baseline: 83ft with RW 03/03/23: 390 feet with rolator 03/10/23: 210 feet SPC Goal status: MET  LONG TERM GOALS: Target date: 03/08/2023  Increase FOTO score to 63 Baseline: 55 Goal status: MET   2.  Increase AROM L hip to 90d flexion and 10d extension  Baseline:  Active ROM Right eval Left eval Left 03/03/23 L A/PROM 03/25/23  Hip flexion 110d 80d 105 d 95/105d  Hip extension 0d 15d  15d   Goal  status: MET  3.  545ft ambulation with LRAD Baseline: 38ft with RW 03/03/23: 390 with rolator 03/25/23 500+ ft w/o AD 16 steps with B rails and step through pattern Goal status: Met  4.  Able to negotiate 16 steps with most appropriate pattern Baseline: 5-6 steps with rail and step to pattern 03/10/23: started 4 inch left step ups , tolerated well  03/25/23 500+ ft w/o AD 16 steps with B rails and step through pattern Goal status: Met  5.  Increase L hip strength to 4/5 Baseline:  MMT Right eval Left eval Left  03/03/23 L 03/25/23  Hip flexion 5 3 4- 4  Hip extension 5 3  4   Hip abduction 5 3  4    Goal status: Met   PLAN: PT FREQUENCY: 1-2x/week  PT DURATION: 6 weeks  PLANNED INTERVENTIONS: Therapeutic exercises, Therapeutic activity, Neuromuscular re-education, Balance training, Gait training, Patient/Family education, Self Care, Joint mobilization, Stair training, DME instructions, Aquatic  Therapy, Dry Needling, Electrical stimulation, Cryotherapy, Moist heat, Manual therapy, and Re-evaluation  PLAN FOR NEXT SESSION: HEP review and update, manual techniques as appropriate, aerobic tasks, ROM and flexibility activities, strengthening and PREs, TPDN, gait and balance training as needed  , step ups    Learta Codding PT  03/25/23 3:21 PM Phone: [phone] Fax: [phone]

## 2023-03-25 ENCOUNTER — Ambulatory Visit: Payer: Medicare PPO | Attending: Orthopedic Surgery

## 2023-03-25 DIAGNOSIS — M6281 Muscle weakness (generalized): Secondary | ICD-10-CM | POA: Diagnosis present

## 2023-03-25 DIAGNOSIS — R2689 Other abnormalities of gait and mobility: Secondary | ICD-10-CM | POA: Insufficient documentation

## 2023-03-25 DIAGNOSIS — M25552 Pain in left hip: Secondary | ICD-10-CM | POA: Diagnosis present

## 2023-03-31 ENCOUNTER — Ambulatory Visit: Payer: Medicare PPO | Admitting: Physical Therapy

## 2023-12-22 ENCOUNTER — Ambulatory Visit (INDEPENDENT_AMBULATORY_CARE_PROVIDER_SITE_OTHER): Admitting: Orthopaedic Surgery

## 2023-12-22 ENCOUNTER — Encounter: Payer: Self-pay | Admitting: Orthopaedic Surgery

## 2023-12-22 DIAGNOSIS — M25641 Stiffness of right hand, not elsewhere classified: Secondary | ICD-10-CM

## 2023-12-22 DIAGNOSIS — M65342 Trigger finger, left ring finger: Secondary | ICD-10-CM

## 2023-12-22 NOTE — Progress Notes (Signed)
 The patient is an 83 year old female that I am seeing for the first time as a new patient.  She is right-hand dominant and says she cannot hold up or extend her right thumb at all.  She says has been going on for about a month with no known injury.  She also has left hand pain and points to the ring finger triggering.  She is a diabetic.  Hemoglobin A1c is just over 7.  On my exam today she cannot extend her right thumb at all.  Even when I extended passively the thumb does drop down and again she says this is new for her.  This does not cause any pain and there is no numbness and tingling.  Her left hand shows normal thumb extension as well as flexion and abduction.  There is pain over the ring finger A1 pulley and active triggering.  Given the fact that this is a new issue for her and she cannot extend her right thumb, it is essential we get her into our hand specialist Dr. Erwin for further evaluation and treatment of this issue.  He will be able to also address her left ring finger trigger finger.  We will work on getting that appointment soon.

## 2023-12-27 ENCOUNTER — Other Ambulatory Visit (INDEPENDENT_AMBULATORY_CARE_PROVIDER_SITE_OTHER): Payer: Self-pay

## 2023-12-27 ENCOUNTER — Ambulatory Visit: Admitting: Orthopedic Surgery

## 2023-12-27 DIAGNOSIS — S66292A Other specified injury of extensor muscle, fascia and tendon of left thumb at wrist and hand level, initial encounter: Secondary | ICD-10-CM | POA: Diagnosis not present

## 2023-12-27 DIAGNOSIS — M79642 Pain in left hand: Secondary | ICD-10-CM

## 2023-12-27 DIAGNOSIS — M65342 Trigger finger, left ring finger: Secondary | ICD-10-CM

## 2023-12-27 DIAGNOSIS — M79641 Pain in right hand: Secondary | ICD-10-CM | POA: Diagnosis not present

## 2023-12-27 MED ORDER — BETAMETHASONE SOD PHOS & ACET 6 (3-3) MG/ML IJ SUSP
6.0000 mg | INTRAMUSCULAR | Status: AC | PRN
  Administered 2023-12-27 (×2): 6 mg via INTRA_ARTICULAR

## 2023-12-27 MED ORDER — LIDOCAINE HCL 1 % IJ SOLN
1.0000 mL | INTRAMUSCULAR | Status: AC | PRN
Start: 2023-12-27 — End: 2023-12-27
  Administered 2023-12-27 (×2): 1 mL

## 2023-12-27 NOTE — Progress Notes (Signed)
 Anna Byrd - 83 y.o. female MRN [id number]  Date of birth: 1941-04-02  Office Visit Note: Visit Date: 12/27/2023 PCP: Andrew Truman GRADE., MD Referred by: Andrew Truman GRADE.,*  Subjective: No chief complaint on file.  HPI: Anna Byrd is a pleasant 83 y.o. female who presents today for bilateral hand complaints.  On the right side, she has lost the ability to extend the right thumb at the MP and IP region.  Denies any recent trauma, states that this has been for approximately the last month or so without inciting incident.  She was sent to me urgently by my partner Dr. Vernetta for specific hand surgical evaluation of this condition.  As for the left hand, she is having ongoing triggering of the ring finger with notable catching and locking.  Has not undergone any significant treatment for this.  Pertinent ROS were reviewed with the patient and found to be negative unless otherwise specified above in HPI.   Visit Reason: can not extend right thumb, palm of left hand burning Duration of symptoms: 1 month Hand dominance: right Occupation: retired, takes care of 9 year old grandson Diabetic: Yes, 7.2 Smoking: No Heart/Lung History: none Blood Thinners: none  Prior Testing/EMG: none Injections (Date): none Treatments: tried medication with very little relief Prior Surgery: none  Assessment & Plan: Visit Diagnoses:  1. Other specified injury of extensor muscle, fascia and tendon of left thumb at wrist and hand level, initial encounter   2. Pain in left hand   3. Pain in right hand   4. Trigger finger, left ring finger     Plan: Based on clinical examination today, there is legitimate concern for potential spontaneous rupture of the extensor mechanism of the right thumb.  She is unable to perform active extension at the IP of the thumb, extension at the MCP is also severely limited compared to the contralateral side.  Without any recent trauma, this is quite  atypical however the concept of spontaneous rupture was discussed in detail today.  I have recommended that she undergo an urgent MRI of the right hand in order to better delineate the status of her extensor mechanism of the right thumb in order to better guide treatment.  I did discuss with her the possibility of tendon transfer, particular EIP to EPL should there be insufficient extensor mechanism of the thumb moving forward.  As for the left hand, extensive discussion was had with the patient today regarding her left ring trigger digit.  We discussed the etiology and pathophysiology of stenosing tenosynovitis.  We discussed conservative versus surgical treatment modalities.  From a conservative standpoint, we discussed activity modification, splinting, therapy and injections.  From a surgical standpoint, we discussed the possibility for trigger digit release as well as all risk and benefits associated.  Given that she has not trialed conservative treatments, patient is appropriate candidate for cortisone injection to the left ring finger A1 pulley for symptom relief.  Risks and benefit of the cortisone injection were discussed in detail, patient agreed to proceed.  Injection was provided today without issue, patient will return in approximate 6 weeks time for a recheck.  I spent 45 minutes in the care of this patient today including review of previous documentation, imaging obtained, face-to-face time discussing all options regarding treatment and documenting the encounter.    Follow-up: No follow-ups on file.   Meds & Orders: No orders of the defined types were placed in this encounter.   Orders Placed This Encounter  Procedures   Hand/UE Inj   XR Hand Complete Left   XR Hand Complete Right   MR HAND RIGHT WO CONTRAST     Procedures: Hand/UE Inj: L ring A1 for trigger finger on 12/27/2023 8:53 PM Indications: pain Details: 25 G needle, volar approach Medications: 1 mL lidocaine  1 %; 6 mg  betamethasone  acetate-betamethasone  sodium phosphate 6 (3-3) MG/ML Outcome: tolerated well, no immediate complications Procedure, treatment alternatives, risks and benefits explained, specific risks discussed. Consent was given by the patient. Patient was prepped and draped in the usual sterile fashion.          Clinical History: No specialty comments available.  She reports that she has never smoked. She has never used smokeless tobacco. No results for input(s): HGBA1C, LABURIC in the last 8760 hours.  Objective:   Vital Signs: There were no vitals taken for this visit.  Physical Exam  Gen: Well-appearing, in no acute distress; non-toxic CV: Regular Rate. Well-perfused. Warm.  Resp: Breathing unlabored on room air; no wheezing. Psych: Fluid speech in conversation; appropriate affect; normal thought process  Ortho Exam Right hand: - No significant pain over the wrist or forearm region, hand without significant swelling or tenderness - Unable to initiate extension at the IP or MCP of the thumb, unable to perform thumb retropulsion  Left hand: - Palpable nodule at the A1 pulley of the ring finger, associated tenderness - Notable clicking with deep flexion of the ring finger, there is evidence of significant locking with deep flexion - Sensation intact distally, hand remains warm well-perfused   Imaging: XR Hand Complete Left Result Date: 12/27/2023 There is no evidence of fracture or dislocation.  Moderate degenerative changes are seen diffusely throughout the small joints. Soft tissues are unremarkable.  XR Hand Complete Right Result Date: 12/27/2023 There is no evidence of fracture or dislocation.  Moderate degenerative changes are seen diffusely throughout the small joints. Soft tissues are unremarkable.    Past Medical/Family/Surgical/Social History: Medications & Allergies reviewed per EMR, new medications updated. Patient Active Problem List   Diagnosis Date Noted    Elevated lactic acid level    SIRS (systemic inflammatory response syndrome) (HCC)    Abdominal pain 09/24/2014   Sepsis (HCC) 09/24/2014   Diabetes mellitus type 2, controlled (HCC) 09/24/2014   Essential hypertension 09/24/2014   Past Medical History:  Diagnosis Date   Diabetes mellitus without complication (HCC)    Gout    Hypertension    Thyroid disease    Family History  Problem Relation Age of Onset   Diabetes Mellitus II Mother    Hypertension Mother    Stroke Sister    Past Surgical History:  Procedure Laterality Date   ABDOMINAL HYSTERECTOMY     CHOLECYSTECTOMY     Social History   Occupational History   Not on file  Tobacco Use   Smoking status: Never   Smokeless tobacco: Never  Substance and Sexual Activity   Alcohol use: No   Drug use: No   Sexual activity: Not on file    Yanique Mulvihill Afton Alderton, M.D. [facility], Hand Surgery

## 2023-12-28 ENCOUNTER — Encounter: Payer: Self-pay | Admitting: Orthopedic Surgery

## 2024-01-24 ENCOUNTER — Encounter: Payer: Self-pay | Admitting: *Deleted

## 2024-01-26 ENCOUNTER — Ambulatory Visit
Admission: RE | Admit: 2024-01-26 | Discharge: 2024-01-26 | Disposition: A | Discharge status: Home / Self Care | Source: Ambulatory Visit | Attending: Orthopedic Surgery | Admitting: Orthopedic Surgery

## 2024-01-26 DIAGNOSIS — M79641 Pain in right hand: Secondary | ICD-10-CM

## 2024-03-15 ENCOUNTER — Ambulatory Visit: Admitting: Orthopedic Surgery

## 2024-03-15 DIAGNOSIS — M25641 Stiffness of right hand, not elsewhere classified: Secondary | ICD-10-CM | POA: Diagnosis not present

## 2024-03-15 NOTE — Progress Notes (Signed)
 Anna Byrd - 83 y.o. female MRN [id number]  Date of birth: 07-22-1940  Office Visit Note: Visit Date: 03/15/2024 PCP: Andrew Truman GRADE., MD Referred by: Andrew Truman GRADE.,*  Subjective: No chief complaint on file.  HPI: Anna Byrd is a pleasant 83 y.o. female who returns today for follow-up for her right thumb.  She has lost the ability to extend the right thumb at the MP and IP region.  Denies any recent trauma, states that this has been for approximately the last 2 months or so without inciting incident.  She was sent to me by my partner Dr. Vernetta for specific hand surgical evaluation of this condition.  She underwent MRI of the right hand as instructed, here today for follow-up of results.   Pertinent ROS were reviewed with the patient and found to be negative unless otherwise specified above in HPI.   Assessment & Plan: Visit Diagnoses:  1. Decreased range of motion of right thumb      Plan: Based on clinical examination today, there mains legitimate concern for potential spontaneous rupture of the extensor mechanism of the right thumb.  She is unable to perform active extension at the IP of the thumb, extension at the MCP is also severely limited compared to the contralateral side.  We reviewed her MRI in detail today, the EPL tendon is fairly indistinct distal to the MCP region of the thumb which is consistent with her clinical examination.  Once again, I did discuss with her the possibility of tendon transfer, particular EIP to EPL at this juncture.  Risks and benefits of the procedure were discussed, risks including but not limited to infection, bleeding, scarring, stiffness, nerve injury, tendon injury, vascular injury, hardware complication, recurrence of symptoms and need for subsequent operation.  We also discussed the appropriate postoperative protocol and timeframe for return to activities and function.  Patient expressed understanding.    At this  juncture, she states that the right thumb was not severely limiting her with activities of daily living, there is no significant pain.  Understanding the risks and benefits of the procedure, she would like to continue with conservative measures for the time being with observation.  I did explain that the tendon transfer procedure will be available to her in the future should she experience worsening dysfunction or pain in this region.  She expressed full understanding, will return as needed moving forward.  I spent 30 minutes in the care of this patient today including review of previous documentation, imaging obtained, face-to-face time discussing all options regarding treatment and documenting the encounter.   Follow-up: No follow-ups on file.   Meds & Orders: No orders of the defined types were placed in this encounter.   No orders of the defined types were placed in this encounter.    Procedures: No procedures performed      Clinical History: No specialty comments available.  She reports that she has never smoked. She has never used smokeless tobacco. No results for input(s): HGBA1C, LABURIC in the last 8760 hours.  Objective:   Vital Signs: There were no vitals taken for this visit.  Physical Exam  Gen: Well-appearing, in no acute distress; non-toxic CV: Regular Rate. Well-perfused. Warm.  Resp: Breathing unlabored on room air; no wheezing. Psych: Fluid speech in conversation; appropriate affect; normal thought process  Ortho Exam Right hand: - No significant pain over the wrist or forearm region, hand without significant swelling or tenderness - Unable to initiate extension at the  IP or MCP of the thumb, unable to perform thumb retropulsion  Left hand: - Palpable nodule at the A1 pulley of the ring finger, associated tenderness - Notable clicking with deep flexion of the ring finger, there is evidence of significant locking with deep flexion - Sensation intact distally,  hand remains warm well-perfused   Imaging: No results found.   Past Medical/Family/Surgical/Social History: Medications & Allergies reviewed per EMR, new medications updated. Patient Active Problem List   Diagnosis Date Noted   Elevated lactic acid level    SIRS (systemic inflammatory response syndrome) (HCC)    Abdominal pain 09/24/2014   Sepsis (HCC) 09/24/2014   Diabetes mellitus type 2, controlled (HCC) 09/24/2014   Essential hypertension 09/24/2014   Past Medical History:  Diagnosis Date   Diabetes mellitus without complication (HCC)    Gout    Hypertension    Thyroid disease    Family History  Problem Relation Age of Onset   Diabetes Mellitus II Mother    Hypertension Mother    Stroke Sister    Past Surgical History:  Procedure Laterality Date   ABDOMINAL HYSTERECTOMY     CHOLECYSTECTOMY     Social History   Occupational History   Not on file  Tobacco Use   Smoking status: Never   Smokeless tobacco: Never  Substance and Sexual Activity   Alcohol use: No   Drug use: No   Sexual activity: Not on file    Anna Byrd, M.D. [facility], Hand Surgery

## 2024-03-20 ENCOUNTER — Encounter: Payer: Self-pay | Admitting: Radiology
